# Patient Record
Sex: Female | Born: 1980 | Race: White | Hispanic: No | Marital: Married | Smoking: Former smoker
Health system: Southern US, Community
[De-identification: ages and names within clinical notes are randomized; demographics above are authoritative.]

## PROBLEM LIST (undated history)

## (undated) ENCOUNTER — Inpatient Hospital Stay (HOSPITAL_COMMUNITY): Payer: Self-pay

## (undated) DIAGNOSIS — F329 Major depressive disorder, single episode, unspecified: Secondary | ICD-10-CM

## (undated) DIAGNOSIS — D259 Leiomyoma of uterus, unspecified: Secondary | ICD-10-CM

## (undated) DIAGNOSIS — N83209 Unspecified ovarian cyst, unspecified side: Secondary | ICD-10-CM

## (undated) DIAGNOSIS — F17200 Nicotine dependence, unspecified, uncomplicated: Secondary | ICD-10-CM

## (undated) DIAGNOSIS — F32A Depression, unspecified: Secondary | ICD-10-CM

## (undated) HISTORY — DX: Nicotine dependence, unspecified, uncomplicated: F17.200

## (undated) HISTORY — DX: Major depressive disorder, single episode, unspecified: F32.9

## (undated) HISTORY — PX: WISDOM TOOTH EXTRACTION: SHX21

## (undated) HISTORY — DX: Leiomyoma of uterus, unspecified: D25.9

## (undated) HISTORY — DX: Depression, unspecified: F32.A

## (undated) HISTORY — DX: Unspecified ovarian cyst, unspecified side: N83.209

---

## 2004-11-19 ENCOUNTER — Other Ambulatory Visit: Admission: RE | Admit: 2004-11-19 | Discharge: 2004-11-19 | Payer: Self-pay | Admitting: Gynecology

## 2005-12-12 ENCOUNTER — Other Ambulatory Visit: Admission: RE | Admit: 2005-12-12 | Discharge: 2005-12-12 | Payer: Self-pay | Admitting: Gynecology

## 2006-01-26 ENCOUNTER — Emergency Department (HOSPITAL_COMMUNITY): Admission: EM | Admit: 2006-01-26 | Discharge: 2006-01-26 | Payer: Self-pay | Admitting: Emergency Medicine

## 2006-12-30 ENCOUNTER — Other Ambulatory Visit: Admission: RE | Admit: 2006-12-30 | Discharge: 2006-12-30 | Payer: Self-pay | Admitting: Gynecology

## 2008-01-19 ENCOUNTER — Encounter: Payer: Self-pay | Admitting: Women's Health

## 2008-01-19 ENCOUNTER — Other Ambulatory Visit: Admission: RE | Admit: 2008-01-19 | Discharge: 2008-01-19 | Payer: Self-pay | Admitting: Gynecology

## 2008-01-19 ENCOUNTER — Ambulatory Visit: Payer: Self-pay | Admitting: Women's Health

## 2008-05-11 HISTORY — PX: APPENDECTOMY: SHX54

## 2008-06-07 ENCOUNTER — Encounter (INDEPENDENT_AMBULATORY_CARE_PROVIDER_SITE_OTHER): Payer: Self-pay | Admitting: Surgery

## 2008-06-07 ENCOUNTER — Observation Stay (HOSPITAL_COMMUNITY): Admission: EM | Admit: 2008-06-07 | Discharge: 2008-06-08 | Payer: Self-pay | Admitting: Emergency Medicine

## 2008-06-07 ENCOUNTER — Ambulatory Visit: Payer: Self-pay | Admitting: Women's Health

## 2008-07-14 IMAGING — CR DG KNEE 1-2V*L*
2 series · 2 of 2 positions shown · non-contrast
Comparison: none

CLINICAL DATA: Left knee pain.
 LEFT KNEE ? 2 VIEW:

[view not recorded (1 of 2)]
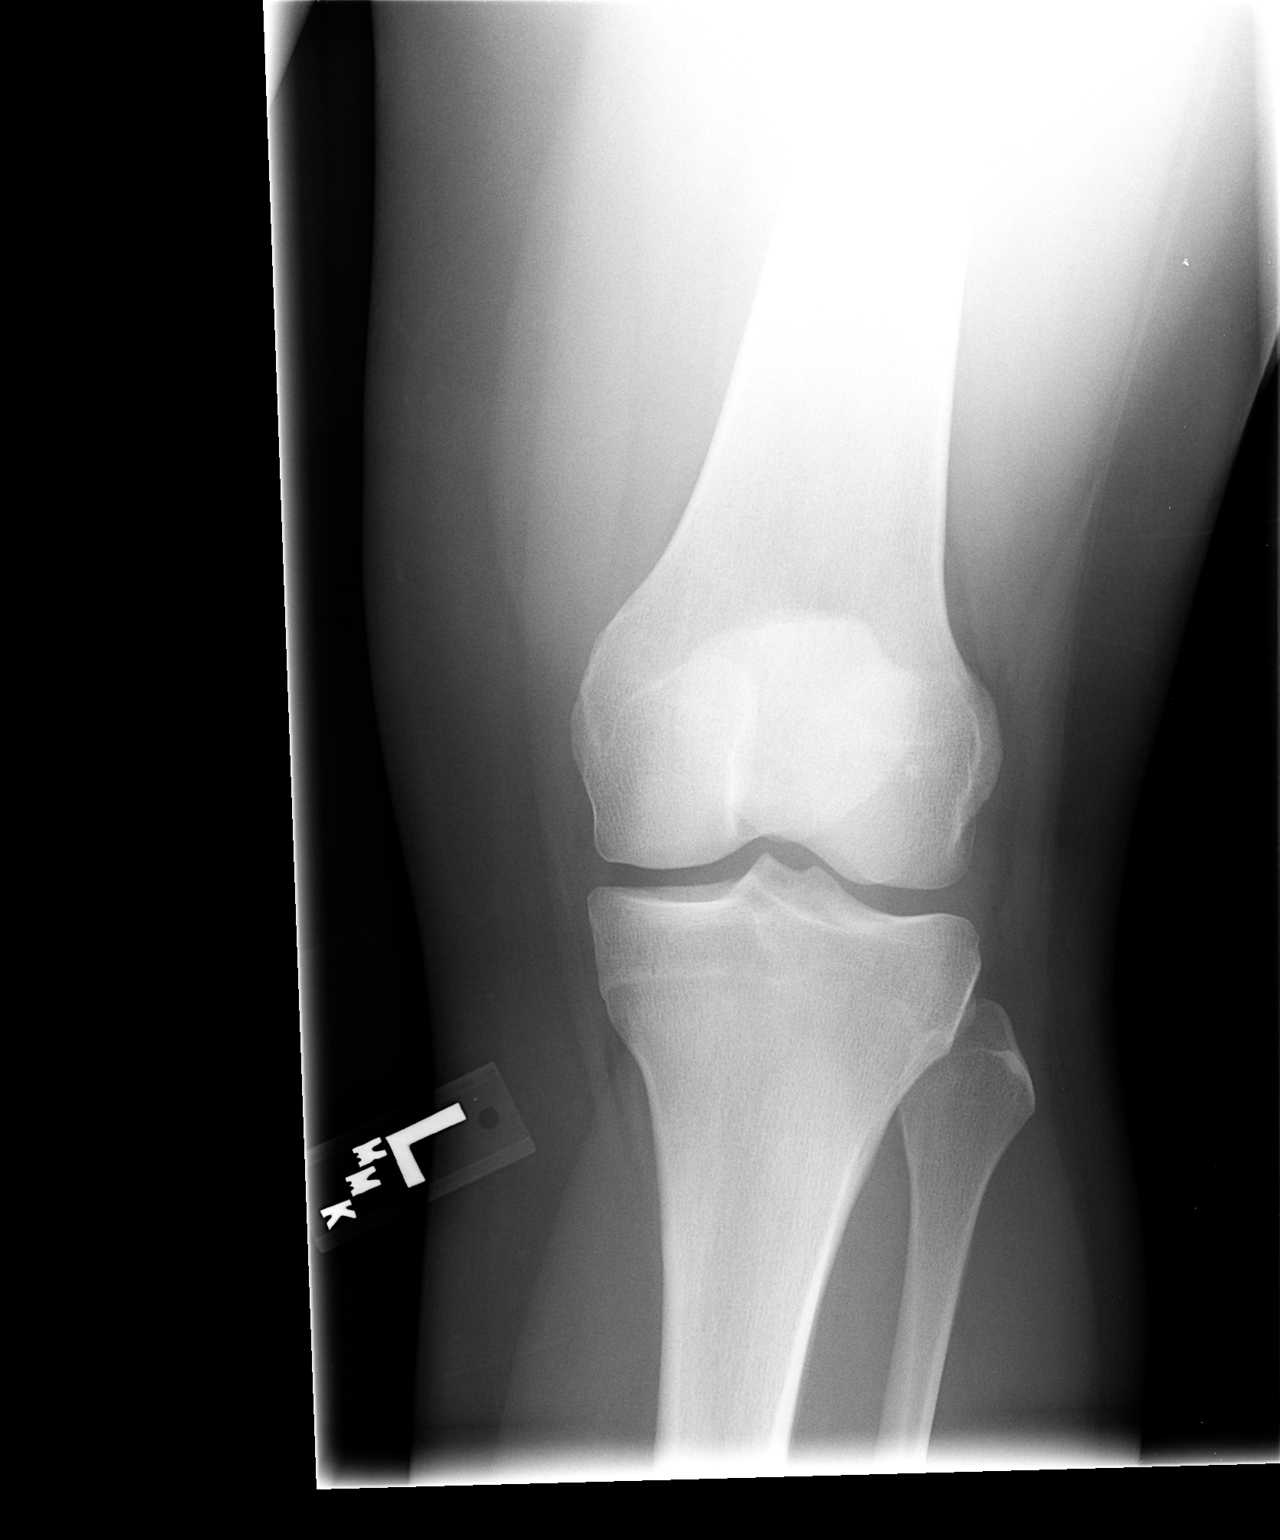

[view not recorded (2 of 2)]
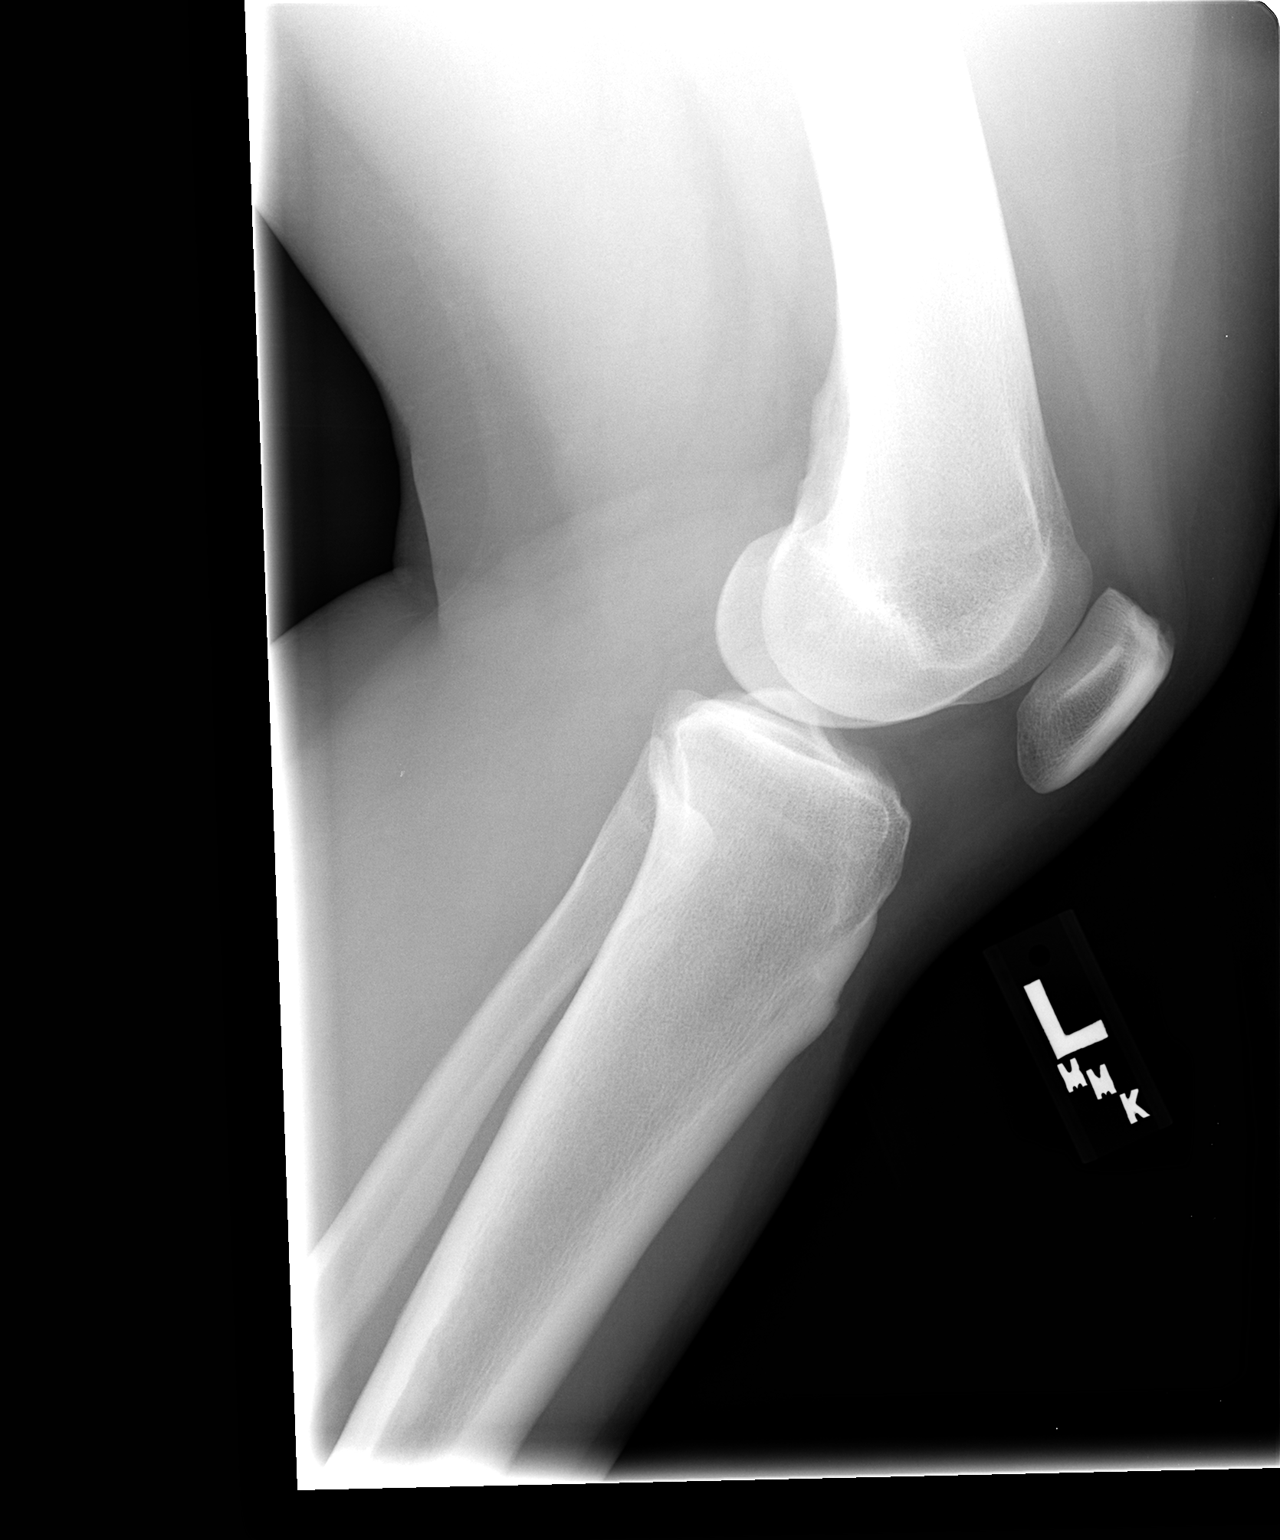

[2 of 2 positions shown; findings below may reference images not displayed]

FINDINGS: There is no evidence of fracture, dislocation, or joint effusion.  There is no evidence of arthropathy or other focal bone abnormality.  Soft tissues are unremarkable.
IMPRESSION: Negative.

## 2009-04-19 ENCOUNTER — Ambulatory Visit: Payer: Self-pay | Admitting: Women's Health

## 2009-04-19 ENCOUNTER — Other Ambulatory Visit: Admission: RE | Admit: 2009-04-19 | Discharge: 2009-04-19 | Payer: Self-pay | Admitting: Gynecology

## 2010-05-22 LAB — URINALYSIS, ROUTINE W REFLEX MICROSCOPIC
Nitrite: NEGATIVE
Protein, ur: NEGATIVE mg/dL
Specific Gravity, Urine: 1.012 (ref 1.005–1.030)
Urobilinogen, UA: 0.2 mg/dL (ref 0.0–1.0)
pH: 6 (ref 5.0–8.0)

## 2010-05-22 LAB — BASIC METABOLIC PANEL
CO2: 26 mEq/L (ref 19–32)
Calcium: 9.3 mg/dL (ref 8.4–10.5)
Chloride: 106 mEq/L (ref 96–112)
Creatinine, Ser: 0.86 mg/dL (ref 0.4–1.2)
GFR calc non Af Amer: >60 mL/min (ref >60)
Potassium: 4.1 mEq/L (ref 3.5–5.1)

## 2010-05-22 LAB — CBC
HCT: 41.8 % (ref 36.0–46.0)
Hemoglobin: 14.3 g/dL (ref 12.0–15.0)
MCHC: 34.2 g/dL (ref 30.0–36.0)
RBC: 4.57 MIL/uL (ref 3.87–5.11)
WBC: 13.9 10*3/uL — ABNORMAL HIGH (ref 4.0–10.5)

## 2010-05-22 LAB — DIFFERENTIAL
Basophils Absolute: 0.1 10*3/uL (ref 0.0–0.1)
Basophils Relative: 0 % (ref 0–1)
Eosinophils Relative: 1 % (ref 0–5)
Monocytes Relative: 6 % (ref 3–12)
Neutro Abs: 10.2 10*3/uL — ABNORMAL HIGH (ref 1.7–7.7)
Neutrophils Relative %: 73 % (ref 43–77)

## 2010-06-25 NOTE — Op Note (Signed)
NAMESAMEEN, LEAS                  ACCOUNT NO.:  [id number]   MEDICAL RECORD NO.:  [id number]          PATIENT TYPE:  OBV   LOCATION:  0111                         FACILITY:  [facility]   PHYSICIAN:  Clovis Pu. Cornett, M.D.DATE OF BIRTH:  1980-08-30   DATE OF PROCEDURE:  06/07/2008  DATE OF DISCHARGE:                               OPERATIVE REPORT   PREOPERATIVE DIAGNOSIS:  Acute appendicitis.   POSTOPERATIVE DIAGNOSIS:  Acute appendicitis.   PROCEDURE:  Laparoscopic appendectomy.   SURGEON:  Dr. Harriette Bouillon.   ANESTHESIA:  General endotracheal anesthesia with 0.25% Sensorcaine  local.   ESTIMATED BLOOD LOSS:  20 mL.   SPECIMEN:  Appendix to pathology.   DRAINS:  None.   INDICATIONS FOR PROCEDURE:  The patient is a 30 year old female with a 1-  day history of right lower quadrant pain.  She was seen in the emergency  room where a CT scan was obtained after examination and findings of  acute appendicitis were noted.  She is brought to the operating room for  laparoscopic appendectomy, possible open appendectomy for acute  appendicitis.  Preoperatively, we discussed the procedure, risks,  complications and long-term outcomes of the procedure.  She voiced  understanding of the above and agreed to proceed.   DESCRIPTION OF PROCEDURE:  The patient was brought to the operating room  and placed supine.  After induction of general anesthesia, the abdomen  was prepped and draped in a sterile fashion.  A Foley catheter was  placed and the left arm was tucked.  A 1-cm supraumbilical incision was  made.  Dissection was carried down and her fascia was opened in the  midline.  A pursestring suture of 0 Vicryl was placed a 12-mm Hassan  cannula was placed under direct vision.  Pneumoperitoneum was created to  15 mmHg of CO2 and the laparoscope was placed.  Upon inspection, I saw  no gross abnormality.  The small bowel and colon were normal.  The  gallbladder, liver and stomach were all  grossly normal.  Both ovaries  and uterus appeared grossly normal.  Two 5-mm ports were placed, one in  the midline halfway between the umbilicus and pubic symphysis and the  second in the left lower quadrant.  The cecum was identified.  The  appendix was then identified rolling the patient to her left.  There was  some acute changes to it.  The harmonic scalpel was used to take the  mesoappendix down to the base of the cecum.  Using a 5-mm scope with, a  GIA 45 stapling device was placed in the umbilical port, placed across  the base of the appendix and fired.  The appendix was placed in an  EndoCatch bag and extracted.  This was passed off the field.  We  reinserted the 10 mm scope.  There was some oozing from the staple line  and Surgicel was placed with an excellent hemostatic result.  The  mesoappendix was examined closely with no signs of any bleeding.  We  irrigated out any excess fluid and blood until clear.  At  this point in  time, I reexamined the small bowel, colon, gallbladder, stomach, uterus  and ovaries and saw no evidence of any injury or any abnormality.  At  this point in time, I removed the scope, I removed the ports, checking  for port site bleeding which there was none and passed these instruments  off the field.  The CO2 was allowed to escape.  At this point in time,  we closed the umbilical port with a pursestring suture of 0  Vicryl and a 4-0 Monocryl was used to close the skin in a subcuticular  fashion.  Dermabond was applied.  All final counts of sponge, needle and  instruments were found to be correct at the end of the case.  The  patient was then awakened, extubated and taken to the recovery room in  satisfactory condition.      Thomas A. Cornett, M.D.  Electronically Signed     TAC/MEDQ  D:  06/07/2008  T:  06/07/2008  Job:  [id number]

## 2010-06-25 NOTE — Discharge Summary (Signed)
Kerry [name], Kerry [name]                  ACCOUNT NO.:  [id number]   MEDICAL RECORD NO.:  [id number]          PATIENT TYPE:  OBV   LOCATION:  1523                         FACILITY:  [facility]   PHYSICIAN:  Clovis Pu. Cornett, M.D.DATE OF BIRTH:  06-12-1980   DATE OF ADMISSION:  06/07/2008  DATE OF DISCHARGE:                               DISCHARGE SUMMARY   ADMISSION DIAGNOSIS:  Acute appendicitis.   DISCHARGE DIAGNOSIS:  Acute appendicitis.   PROCEDURES PERFORMED:  Laparoscopic appendectomy.   BRIEF HISTORY:  The patient is a 30 year old female with acute  appendicitis who was admitted on June 07, 2008.  She underwent  laparoscopic appendectomy with no immediate complications on June 07, 2008.   HOSPITAL COURSE:  Hospital course was unremarkable.  She was discharged  home on postoperative day 1, tolerating her diet, having adequate p.o.  pain control.  Wounds were clean, dry, and intact.  Vital signs were  stable without fever.   DISCHARGE INSTRUCTIONS:  She will follow up in 3 to 4 weeks.  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  She may shower.  She will be  given Vicodin 1 to 2 q.4h. p.r.n. pain.  Follow up with me in 3 to 4  weeks.      Thomas A. Cornett, M.D.  Electronically Signed     TAC/MEDQ  D:  06/08/2008  T:  06/08/2008  Job:  [id number]

## 2010-06-25 NOTE — Consult Note (Signed)
Kerry [name], CHOKSHI                  ACCOUNT NO.:  [id number]   MEDICAL RECORD NO.:  [id number]          PATIENT TYPE:  OBV   LOCATION:  0111                         FACILITY:  [facility]   PHYSICIAN:  Clovis Pu. Cornett, M.D.DATE OF BIRTH:  04/18/1980   DATE OF CONSULTATION:  06/07/2008  DATE OF DISCHARGE:                                 CONSULTATION   REFERRING PHYSICIAN:  Dr. Effie Shy of the emergency room.   REASON FOR CONSULTATION:  Abdominal pain.   HISTORY OF PRESENT ILLNESS:  The patient is a 30 year old female with a  1-day history of right lower quadrant pain.  The pain started yesterday  morning, began to increase as the day went on yesterday and became  severe, probably on a scale of 7/10 to 8/10 to 9/10.  Overnight, she  placed a heating pad over her abdomen and this gave her some relief, but  this morning the pain became worse, located in her right lower quadrant  without radiation.  Heating pad made it somewhat better, but this  morning it became worse, severe in nature without radiation, sharp in  the right lower quadrant.  She had associated nausea and vomiting with  it.  Her bowel function and menstrual function had been normal.  Denies  any significant emesis.  The heating pad makes the pain better.  She was  seen today by Dr. Effie Shy in the emergency room who consulted me to see  her.   PAST MEDICAL HISTORY:  None.   PAST SURGICAL HISTORY:  None.   FAMILY HISTORY:  Positive for cancer and diabetes.   SOCIAL HISTORY:  Positive for tobacco use within the last 12 months.  She is married.  Denies drug use.  Does drink alcohol occasionally.   ALLERGIES TO MEDICATIONS:  None.   MEDICATIONS:  None, except birth control and is using the Ring.   REVIEW OF SYSTEMS:  Positive for abdominal pain, nausea, vomiting.  Denies any fever, chills.  Bowel functions has been normal.  Menstrual  periods have been normal.  Otherwise remainder of her 10-point review of  systems is  negative.   PHYSICAL EXAMINATION:  VITAL SIGNS:  Temperature is 98, pulse 99, blood  pressure 123/83.  HEENT:  No evidence of jaundice.  Oropharynx is clear.  Extraocular  movements are intact.  NECK:  Supple, nontender, full range of motion.  PULMONARY:  Clear to auscultation bilaterally.  Chest wall motion is  normal.  CARDIOVASCULAR:  Regular rate and rhythm without rub, murmur or gallop.  EXTREMITIES:  Warm and well perfused.  ABDOMEN:  Tender in the right lower quadrant with positive rebound and  guarding noted.  No mass.  No hernia.  MUSCULOSKELETAL:  Range of motion.  Muscle tone of both normal.  NEUROLOGIC:  Glasgow coma scale of 15.  Motor and sensory function are  grossly intact.   DIAGNOSTIC STUDIES:  Her CT scan of her abdomen and pelvis shows acute  appendicitis without perforation.  She has a white count 13,900 with a  left shift.  Hemoglobin is 14.3, platelet count is 309,000.  Urinalysis  normal.  Pregnancy test is negative.  Sodium 140, potassium 4.1,  chloride 106, CO2 26, BUN 12, creatinine 0.8, glucose 103.   IMPRESSION:  Acute appendicitis.   PLAN:  Recommend laparoscopic appendectomy with possibility of open  procedure.  Risk of bleeding, infection organ injury were discussed with  the patient.  She understands the above and agrees to proceed.      Thomas A. Cornett, M.D.  Electronically Signed     TAC/MEDQ  D:  06/07/2008  T:  06/07/2008  Job:  [id number]   cc:   Markham Jordan L. Effie Shy, M.D.  Fax: [phone]

## 2010-08-22 ENCOUNTER — Other Ambulatory Visit: Payer: Self-pay | Admitting: Women's Health

## 2010-08-22 ENCOUNTER — Encounter (INDEPENDENT_AMBULATORY_CARE_PROVIDER_SITE_OTHER): Payer: BC Managed Care – PPO | Admitting: Women's Health

## 2010-08-22 ENCOUNTER — Other Ambulatory Visit (HOSPITAL_COMMUNITY)
Admission: RE | Admit: 2010-08-22 | Discharge: 2010-08-22 | Disposition: A | Discharge status: Home / Self Care | Payer: BC Managed Care – PPO | Source: Ambulatory Visit | Attending: Gynecology | Admitting: Gynecology

## 2010-08-22 DIAGNOSIS — Z01419 Encounter for gynecological examination (general) (routine) without abnormal findings: Secondary | ICD-10-CM

## 2010-08-22 DIAGNOSIS — R82998 Other abnormal findings in urine: Secondary | ICD-10-CM

## 2010-08-22 DIAGNOSIS — Z124 Encounter for screening for malignant neoplasm of cervix: Secondary | ICD-10-CM | POA: Insufficient documentation

## 2010-12-31 ENCOUNTER — Telehealth: Payer: Self-pay

## 2010-12-31 NOTE — Telephone Encounter (Signed)
Telephone call, states stopped the nuva ring at the end of a cycle. This is first cycle since being off . Reviewed nuva ring and birth control pills help to keep cycles lighter. Reviewed okay to have a heavy cycle as long as not bleeding greater than one pad per hour. Is planning to try to conceive in the next month or so, will return to the office for a viability ultrasound. Continue prenatal vitamins daily. Encouraged Motrin when necessary for cramping.

## 2010-12-31 NOTE — Telephone Encounter (Signed)
STOPPED NUVARING ABOUT A MONTH AGO  FOR 2 WEKS AFTER HAD BREAST TNDERNESS. C-O SPOTTING AT OVULATION TOO. ALSO STARTED A PERIOD 12-30-10. STATES THE PERIOD IS VERY HEAVY(CHANGING TAMPONS ABOUT EVERY 2 HOURS). PT. WANTS TO KNOW IF THESE SYMPTOMS ARE NORMAL AFTER STOPPING RX.

## 2011-04-04 ENCOUNTER — Telehealth: Payer: Self-pay | Admitting: *Deleted

## 2011-04-04 NOTE — Telephone Encounter (Signed)
Pt has been off her nuvaring since October 2012. Pt states that every month she has had irregular periods. This month period started feb.12th and ended feb.17th which was a normal period flow. But then started back on feb.20th and still bleeding now. Pt said bleeding is very light, she did take a UPT and it was negative. Pt wanted to f/u with you about this. Please advise

## 2011-04-04 NOTE — Telephone Encounter (Signed)
Telephone call to review cycle periods stopped nuva ring in October had a normal cycle in November, December and January. February first  month for unusual cycle. Prior every 28 days for 5 days. Will watch at this time, if unusual. In march office visit to check labs. Return to office with missed cycle for viability ultrasound. Continue prenatal vitamins daily is aware safe pregnancy behaviors.

## 2011-05-29 ENCOUNTER — Telehealth: Payer: Self-pay | Admitting: *Deleted

## 2011-05-29 NOTE — Telephone Encounter (Signed)
Telephone call, if no cycle by Monday 4/22 office visit, we'll check hCG qualitative. Continue prenatal vitamins daily. States slight breast tenderness no nausea no other symptoms.

## 2011-05-29 NOTE — Telephone Encounter (Signed)
Pt and her husband have been trying to conceive for about 6 months now, her period is 4-5 days late and she took 2 UPT test and both are negative. LMP: 04/28/11. Please advise

## 2011-09-08 ENCOUNTER — Encounter: Payer: Self-pay | Admitting: Women's Health

## 2011-09-08 ENCOUNTER — Ambulatory Visit (INDEPENDENT_AMBULATORY_CARE_PROVIDER_SITE_OTHER): Payer: BC Managed Care – PPO | Admitting: Women's Health

## 2011-09-08 VITALS — BP 130/88 | Ht 62.5 in | Wt 204.0 lb

## 2011-09-08 DIAGNOSIS — L259 Unspecified contact dermatitis, unspecified cause: Secondary | ICD-10-CM

## 2011-09-08 DIAGNOSIS — L309 Dermatitis, unspecified: Secondary | ICD-10-CM | POA: Insufficient documentation

## 2011-09-08 DIAGNOSIS — Z01419 Encounter for gynecological examination (general) (routine) without abnormal findings: Secondary | ICD-10-CM

## 2011-09-08 DIAGNOSIS — N912 Amenorrhea, unspecified: Secondary | ICD-10-CM

## 2011-09-08 LAB — CBC WITH DIFFERENTIAL/PLATELET
Eosinophils Relative: 1 % (ref 0–5)
HCT: 40.2 % (ref 36.0–46.0)
Hemoglobin: 13.9 g/dL (ref 12.0–15.0)
Lymphocytes Relative: 30 % (ref 12–46)
MCHC: 34.6 g/dL (ref 30.0–36.0)
MCV: 89.1 fL (ref 78.0–100.0)
Neutro Abs: 6.1 10*3/uL (ref 1.7–7.7)
Platelets: 330 10*3/uL (ref 150–400)
RBC: 4.51 MIL/uL (ref 3.87–5.11)
RDW: 13.2 % (ref 11.5–15.5)

## 2011-09-08 LAB — TSH: TSH: 2.751 u[IU]/mL (ref 0.350–4.500)

## 2011-09-08 MED ORDER — MEDROXYPROGESTERONE ACETATE 10 MG PO TABS: 10.0000 mg | ORAL_TABLET | Freq: Every day | ORAL | Status: DC

## 2011-09-08 NOTE — Patient Instructions (Addendum)
Pregnancy  If you are planning on getting pregnant, it is a good idea to make a preconception appointment with your care- giver to discuss having a healthy lifestyle before getting pregnant. Such as, diet, weight, exercise, taking prenatal vitamins especially folic acid (it helps prevent brain and spinal cord defects), avoiding alcohol, smoking and illegal drugs, medical problems (diabetes, convulsions), family history of genetic problems, working conditions and immunizations. It is better to have knowledge of these things and do something about them before getting pregnant.  In your pregnancy, it is important to follow certain guidelines to have a healthy baby. It is very important to get good prenatal care and follow your caregiver's instructions. Prenatal care includes all the medical care you receive before your baby's birth. This helps to prevent problems during the pregnancy and childbirth.  HOME CARE INSTRUCTIONS    Start your prenatal visits by the 12th week of pregnancy or before when possible. They are usually scheduled monthly at first. They are more often in the last 2 months before delivery. It is important that you keep your caregiver's appointments and follow your caregiver's instructions regarding medication use, exercise, and diet.   During pregnancy, you are providing food for you and your baby. Eat a regular, well-balanced diet. Choose foods such as meat, fish, milk and other dairy products, vegetables, fruits, whole-grain breads and cereals. Your caregiver will inform you of the ideal weight gain depending on your current height and weight. Drink lots of liquids. Try to drink 8 glasses of water a day.   Alcohol is associated with a number of birth defects including fetal alcohol syndrome. It is best to avoid alcohol completely. Smoking will cause low birth rate and prematurity. Use of alcohol and nicotine during your pregnancy also increases the chances that your child will be chemically  dependent later in their life and may contribute to SIDS (Sudden Infant Death Syndrome).   Do not use illegal drugs.   Only take prescription or over-the-counter medications that are recommended by your caregiver. Other medications can cause genetic and physical problems in the baby.   Morning sickness can often be helped by keeping soda crackers at the bedside. Eat a couple before arising in the morning.   A sexual relationship may be continued until near the end of pregnancy if there are no other problems such as early (premature) leaking of amniotic fluid from the membranes, vaginal bleeding, painful intercourse or belly (abdominal) pain.   Exercise regularly. Check with your caregiver if you are unsure of the safety of some of your exercises.   Do not use hot tubs, steam rooms or saunas. These increase the risk of fainting or passing out and hurting yourself and the baby. Swimming is OK for exercise. Get plenty of rest, including afternoon naps when possible especially in the third trimester.   Avoid toxic odors and chemicals.   Do not wear high heels. They may cause you to lose your balance and fall.   Do not lift over 5 pounds. If you do lift anything, lift with your legs and thighs, not your back.   Avoid long trips, especially in the third trimester.   If you have to travel out of the city or state, take a copy of your medical records with you.  SEEK IMMEDIATE MEDICAL CARE IF:    You develop an unexplained oral temperature above 102 F (38.9 C), or as your caregiver suggests.   You have leaking of fluid from the vagina. If   your temperature and inform your caregiver of this when you call.   There is vaginal spotting or bleeding. Notify your caregiver of the amount and how many pads are used.   You continue to feel sick to your stomach (nauseous) and have no relief from remedies suggested, or you throw up (vomit) blood or coffee ground like  materials.   You develop upper abdominal pain.   You have round ligament discomfort in the lower abdominal area. This still must be evaluated by your caregiver.   You feel contractions of the uterus.   You do not feel the baby move, or there is less movement than before.   You have painful urination.   You have abnormal vaginal discharge.   You have persistent diarrhea.   You get a severe headache.   You have problems with your vision.   You develop muscle weakness.   You feel dizzy and faint.   You develop shortness of breath.   You develop chest pain.   You have back pain that travels down to your leg and feet.   You feel irregular or a very fast heartbeat.   You develop excessive weight gain in a short period of time (5 pounds in 3 to 5 days).   You are involved with a domestic violence situation.  Document Released: 01/27/2005 Document Revised: 01/16/2011 Document Reviewed: 07/21/2008 [facility] Patient Information 2012 [place], [place].Health Maintenance, Females A healthy lifestyle and preventative care can promote health and wellness.  Maintain regular health, dental, and eye exams.   Eat a healthy diet. Foods like vegetables, fruits, whole grains, low-fat dairy products, and lean protein foods contain the nutrients you need without too many calories. Decrease your intake of foods high in solid fats, added sugars, and salt. Get information about a proper diet from your caregiver, if necessary.   Regular physical exercise is one of the most important things you can do for your health. Most adults should get at least 150 minutes of moderate-intensity exercise (any activity that increases your heart rate and causes you to sweat) each week. In addition, most adults need muscle-strengthening exercises on 2 or more days a week.    Maintain a healthy weight. The body mass index (BMI) is a screening tool to identify possible weight problems. It provides an estimate of body  fat based on height and weight. Your caregiver can help determine your BMI, and can help you achieve or maintain a healthy weight. For adults 20 years and older:   A BMI below 18.5 is considered underweight.   A BMI of 18.5 to 24.9 is normal.   A BMI of 25 to 29.9 is considered overweight.   A BMI of 30 and above is considered obese.   Maintain normal blood lipids and cholesterol by exercising and minimizing your intake of saturated fat. Eat a balanced diet with plenty of fruits and vegetables. Blood tests for lipids and cholesterol should begin at age 57 and be repeated every 5 years. If your lipid or cholesterol levels are high, you are over 50, or you are a high risk for heart disease, you may need your cholesterol levels checked more frequently.Ongoing high lipid and cholesterol levels should be treated with medicines if diet and exercise are not effective.   If you smoke, find out from your caregiver how to quit. If you do not use tobacco, do not start.   If you are pregnant, do not drink alcohol. If you are breastfeeding, be very cautious  about drinking alcohol. If you are not pregnant and choose to drink alcohol, do not exceed 1 drink per day. One drink is considered to be 12 ounces (355 mL) of beer, 5 ounces (148 mL) of wine, or 1.5 ounces (44 mL) of liquor.   Avoid use of street drugs. Do not share needles with anyone. Ask for help if you need support or instructions about stopping the use of drugs.   High blood pressure causes heart disease and increases the risk of stroke. Blood pressure should be checked at least every 1 to 2 years. Ongoing high blood pressure should be treated with medicines, if weight loss and exercise are not effective.   If you are 76 to 30 years old, ask your caregiver if you should take aspirin to prevent strokes.   Diabetes screening involves taking a blood sample to check your fasting blood sugar level. This should be done once every 3 years, after age 58,  if you are within normal weight and without risk factors for diabetes. Testing should be considered at a younger age or be carried out more frequently if you are overweight and have at least 1 risk factor for diabetes.   Breast cancer screening is essential preventative care for women. You should practice "breast self-awareness." This means understanding the normal appearance and feel of your breasts and may include breast self-examination. Any changes detected, no matter how small, should be reported to a caregiver. Women in their 74s and 30s should have a clinical breast exam (CBE) by a caregiver as part of a regular health exam every 1 to 3 years. After age 72, women should have a CBE every year. Starting at age 58, women should consider having a mammogram (breast X-ray) every year. Women who have a family history of breast cancer should talk to their caregiver about genetic screening. Women at a high risk of breast cancer should talk to their caregiver about having an MRI and a mammogram every year.   The Pap test is a screening test for cervical cancer. Women should have a Pap test starting at age 43. Between ages 37 and 53, Pap tests should be repeated every 2 years. Beginning at age 30, you should have a Pap test every 3 years as long as the past 3 Pap tests have been normal. If you had a hysterectomy for a problem that was not cancer or a condition that could lead to cancer, then you no longer need Pap tests. If you are between ages 23 and 18, and you have had normal Pap tests going back 10 years, you no longer need Pap tests. If you have had past treatment for cervical cancer or a condition that could lead to cancer, you need Pap tests and screening for cancer for at least 20 years after your treatment. If Pap tests have been discontinued, risk factors (such as a new sexual partner) need to be reassessed to determine if screening should be resumed. Some women have medical problems that increase the  chance of getting cervical cancer. In these cases, your caregiver may recommend more frequent screening and Pap tests.   The human papillomavirus (HPV) test is an additional test that may be used for cervical cancer screening. The HPV test looks for the virus that can cause the cell changes on the cervix. The cells collected during the Pap test can be tested for HPV. The HPV test could be used to screen women aged 30 years and older, and should be  used in women of any age who have unclear Pap test results. After the age of 44, women should have HPV testing at the same frequency as a Pap test.   Colorectal cancer can be detected and often prevented. Most routine colorectal cancer screening begins at the age of 80 and continues through age 71. However, your caregiver may recommend screening at an earlier age if you have risk factors for colon cancer. On a yearly basis, your caregiver may provide home test kits to check for hidden blood in the stool. Use of a small camera at the end of a tube, to directly examine the colon (sigmoidoscopy or colonoscopy), can detect the earliest forms of colorectal cancer. Talk to your caregiver about this at age 25, when routine screening begins. Direct examination of the colon should be repeated every 5 to 10 years through age 64, unless early forms of pre-cancerous polyps or small growths are found.   Hepatitis C blood testing is recommended for all people born from [birth year] through 1964 and any individual with known risks for hepatitis C.   Practice safe sex. Use condoms and avoid high-risk sexual practices to reduce the spread of sexually transmitted infections (STIs). Sexually active women aged 16 and younger should be checked for Chlamydia, which is a common sexually transmitted infection. Older women with new or multiple partners should also be tested for Chlamydia. Testing for other STIs is recommended if you are sexually active and at increased risk.   Osteoporosis is a  disease in which the bones lose minerals and strength with aging. This can result in serious bone fractures. The risk of osteoporosis can be identified using a bone density scan. Women ages 24 and over and women at risk for fractures or osteoporosis should discuss screening with their caregivers. Ask your caregiver whether you should be taking a calcium supplement or vitamin D to reduce the rate of osteoporosis.   Menopause can be associated with physical symptoms and risks. Hormone replacement therapy is available to decrease symptoms and risks. You should talk to your caregiver about whether hormone replacement therapy is right for you.   Use sunscreen with a sun protection factor (SPF) of 30 or greater. Apply sunscreen liberally and repeatedly throughout the day. You should seek shade when your shadow is shorter than you. Protect yourself by wearing long sleeves, pants, a wide-brimmed hat, and sunglasses year round, whenever you are outdoors.   Notify your caregiver of new moles or changes in moles, especially if there is a change in shape or color. Also notify your caregiver if a mole is larger than the size of a pencil eraser.   Stay current with your immunizations.  Document Released: 08/12/2010 Document Revised: 01/16/2011 Document Reviewed: 08/12/2010 [facility] Patient Information 2012 [place], [place].

## 2011-09-08 NOTE — Progress Notes (Signed)
Kerry [name] March 31, 1980 [id number]    History:    The patient presents for annual exam.  Stopped using nuva ring December 2012, desiring conception. Cycles have progressively gotten more spaced, today 39 days. States has premenstrual breast tenderness, low back ache. States does see  ovulation-type discharge midcycle. Sexually active 3-4 times per week. Has increased exercise and decrease calories with no weight loss. Rubella immune. History of normal Paps. Did not receive gardasil.   Past medical history, past surgical history, family history and social history were all reviewed and documented in the EPIC chart. History of eczema. Works at [employer] in the [employer]. Husband starting a business in land survey.   ROS:  A  ROS was performed and pertinent positives and negatives are included in the history.  Exam:  Filed Vitals:   09/08/11 1450  BP: 130/88    General appearance:  Normal Head/Neck:  Normal, without cervical or supraclavicular adenopathy. Thyroid:  Symmetrical, normal in size, without palpable masses or nodularity. Respiratory  Effort:  Normal  Auscultation:  Clear without wheezing or rhonchi Cardiovascular  Auscultation:  Regular rate, without rubs, murmurs or gallops  Edema/varicosities:  Not grossly evident Abdominal  Soft,nontender, without masses, guarding or rebound.  Liver/spleen:  No organomegaly noted  Hernia:  None appreciated  Skin  Inspection:  Grossly normal  Palpation:  Grossly normal Neurologic/psychiatric  Orientation:  Normal with appropriate conversation.  Mood/affect:  Normal  Genitourinary    Breasts: Examined lying and sitting.     Right: Without masses, retractions, discharge or axillary adenopathy.     Left: Without masses, retractions, discharge or axillary adenopathy.   Inguinal/mons:  Normal without inguinal adenopathy  External genitalia:  Normal  BUS/Urethra/Skene's glands:  Normal  Bladder:  Normal  Vagina:   Normal  Cervix:  Normal  Uterus:   Midline and mobile  Adnexa/parametria:     Rt: Without masses or tenderness.   Lt: Without masses or tenderness.  Anus and perineum: Normal  Digital rectal exam: Normal sphincter tone without palpated masses or tenderness  Assessment/Plan:  31 y.o. M. WF G0 for annual exam desiring conception.  Irregular cycles Desiring conception Obesity  Plan:  CBC, TSH, prolactin, hCG qualitative, UA. No Pap, history of all normal Paps, new screening guidelines reviewed. If qual negative withdraw with Provera 10 for 5 days. Reviewed if all labs are normal, semen analysis, appointment with Dr. Audie Box to discuss fertility management. Will continue frequent intercourse, calcium rich diet, MVI daily encouraged. Aware of safe behaviors in pregnancy. SBE's, continue exercise, decrease calories and carbs for weight loss.  [facility], 3:41 PM 09/08/2011

## 2011-09-09 LAB — URINALYSIS W MICROSCOPIC + REFLEX CULTURE
Bacteria, UA: NONE SEEN
Bilirubin Urine: NEGATIVE
Casts: NONE SEEN
Hgb urine dipstick: NEGATIVE
Ketones, ur: NEGATIVE mg/dL
Leukocytes, UA: NEGATIVE
Nitrite: NEGATIVE
Protein, ur: NEGATIVE mg/dL
Specific Gravity, Urine: 1.015 (ref 1.005–1.030)
Squamous Epithelial / LPF: NONE SEEN
pH: 6 (ref 5.0–8.0)

## 2011-09-10 ENCOUNTER — Telehealth: Payer: Self-pay | Admitting: Women's Health

## 2011-09-10 NOTE — Telephone Encounter (Signed)
Patient informed of results and to take Provera.  Patient wanted me to let [place] know.  You had asked her if mom ever had infertility problems and she said no. Upon talking with her mom she found out that with her first pregnancy she tried for a year, took Clomid and conceived. After that had more children with no infertility issues.  I told patient that did not sound like history that should impact her but I would make you aware of it.  She did not want to schedule a consult with Dr. Velvet Bathe today but may call back.

## 2011-09-10 NOTE — Telephone Encounter (Signed)
Message copied by Keenan Bachelor on Wed Sep 10, 2011 12:46 PM ------      Message from: [place], [place] J      Created: Wed Sep 10, 2011  9:32 AM       Please call and inform blood pregnancy test negative if cycle has not started to take Provera for 5 days call if no cycle within 2 weeks. Inform TSH and prolactin normal. Let me know if she wants to proceed with further testing/consult with Dr. Audie Box to discuss fertility management.

## 2011-10-21 ENCOUNTER — Telehealth: Payer: Self-pay | Admitting: *Deleted

## 2011-10-21 NOTE — Telephone Encounter (Signed)
(  Pt aware you are out of the office)  Pt finished her provera rx had cycle on 09/16/11 lasted until 09/20/11 no cycle since then. Pt is calling to follow up. She asked if you would like try provera again? Or should she just make consult with TF? Please advise

## 2011-10-22 NOTE — Telephone Encounter (Signed)
Telephone call, states has had several negative home U PTs. Has used ovulation predictor kits that have shown no change. Currently on day 38, did have a withdrawal bleed after Provera last month. Stopped [employer] October 2012. Had regular monthly cycles October through January 2013, cycles began spacing, had one cycle 54 days. Normal TSH and prolactin 08/2011. Having intercourse 4-5 times per week. Exercising 5 times per week eating a healthy diet. Instructed to schedule office visit with Dr. Audie Box to develop fertility plan

## 2011-10-28 ENCOUNTER — Encounter: Payer: Self-pay | Admitting: Gynecology

## 2011-10-28 ENCOUNTER — Ambulatory Visit (INDEPENDENT_AMBULATORY_CARE_PROVIDER_SITE_OTHER): Payer: BC Managed Care – PPO | Admitting: Gynecology

## 2011-10-28 VITALS — BP 126/82

## 2011-10-28 DIAGNOSIS — Z3490 Encounter for supervision of normal pregnancy, unspecified, unspecified trimester: Secondary | ICD-10-CM | POA: Insufficient documentation

## 2011-10-28 DIAGNOSIS — Z331 Pregnant state, incidental: Secondary | ICD-10-CM

## 2011-10-28 DIAGNOSIS — N912 Amenorrhea, unspecified: Secondary | ICD-10-CM

## 2011-10-28 LAB — PREGNANCY, URINE: Preg Test, Ur: POSITIVE

## 2011-10-28 NOTE — Progress Notes (Signed)
Patient is a 31 year old now gravida 1 para 0 who has had long-standing history of oligomenorrhea and her last menstrual period was initiated with Provera 10 mg which she took for 10 days. The first day of her last menstrual period was August 6. Her urine pregnancy today was negative in the office. Patient was otherwise asymptomatic with the exception of slight breast tenderness. Patient with no prior history of PID or pelvic surgery.  Exam: Abdomen: Soft nontender no rebound or guarding Pelvic: Bartholin urethra Skene glands within normal limits Vagina: No lesions or discharge Cervix: No lesions or discharge Uterus: 4-6 weeks size nontender Adnexa: No palpable masses or tenderness Rectal exam: Not done  Office urine pregnancy test positive  Assessment/plan: Early first trimester pregnancy. Patient will have a quantitative beta-hCG and return to the office next week for possible ultrasound. She'll be started on prenatal vitamins. Early pregnancy instruction provided. Of note July of this year her CBC, prolactin, TSH were all normal.

## 2011-10-28 NOTE — Patient Instructions (Addendum)
How a Baby Grows During Pregnancy Pregnancy begins when the female's sperm enters the female's egg. This happens in the fallopian tube and is called fertilization. The fertilized egg is called an embryo until it reaches 9 weeks from the time of fertilization. From 9 weeks until birth it is called a fetus. The fertilized egg moves down the tube into the uterus and attaches to the inside lining of the uterus.  The pregnant woman is responsible for the growth of the embryo/fetus by supplying nourishment and oxygen through the blood stream and placenta to the developing fetus. The uterus becomes larger and pops out from the abdomen more and more as the fetus develops and grows. A normal pregnancy lasts 280 days, with a range of 259 to 294 days, or 40 weeks. The pregnancy is divided up into three trimesters:  First trimester - 0 to 13 weeks.   Second trimester - 14 to 27 weeks.   Third trimester - 28 to 40 weeks.  The day your baby is supposed to be born is called estimated date of confinement [facility]) or estimated date of delivery (EDD). GROWTH OF THE BABY MONTH BY MONTH 1. First Month: The fertilized egg attaches to the inside of the uterus and certain cells will form the placenta and others will develop into the fetus. The arms, legs, brain, spinal cord, lungs, and heart begin to develop. At the end of the first month the heart begins to beat. The embryo weighs less than an ounce and is  inch long.  2. Second Month: The bones can be seen, the inner ear, eye lids, hands and feet form and genitals develop. By the end of 8 weeks, all of the major organs are developing. The fetus now weighs less than an ounce and is one inch (2.54 cm) long.  3. Third Month: Teeth buds appear, all the internal organs are forming, bones and muscles begin to grow, the spine can flex and the skin is transparent. Finger and toe nails begin to form, the hands develop faster than the feet and the arms are longer than the legs at this  point. The fetus weighs a little more than an ounce (0.03 kg) and is 3 inches (8.89cm) long.  4. Fourth Month: The placenta is completely formed. The external sex organs, neck, outer ear, eyebrows, eyelids and fingernails are formed. The fetus can hear, swallow, flex its arms and legs and the kidney begins to produce urine. The skin is covered with a white waxy coating (vernix) and very thin hair (lanugo) is present. The fetus weighs 5 ounces (0.14kg) and is 6 to 7 inches (16.51cm) long.  5. Fifth Month: The fetus moves around more and can be felt for the first time (called quickening), sleeps and wakes up at times, may begin to suck its finger and the nails grow to the end of the fingers. The gallbladder is now functioning and helps to digest the nutrients, eggs are formed in the female and the testicles begin to drop down from the abdomen to the scrotum in the female. The fetus weighs  to 1 pound (0.45kg) and is 10 inches (25.4cm) long.  6. Sixth Month: The lungs are formed but the fetus does not breath yet. The eyes open, the brain develops more quickly at this time, one can detect finger and toe prints and thicker hair grows. The fetus weighs 1 to 1 pounds (0.68kg) and is 12 inches (30.48cm) long.  7. Seventh Month: The fetus can hear and  respond to sounds, kicks and stretches and can sense changes in light. The fetus weighs 2 to 2 pounds (1.13kg) and is 14 inches (35.56cm) long.  8. Eight Month: All organs and body systems are fully developed and functioning. The bones get harder, taste buds develop and can taste sweet and sour flavors and the fetus may hiccup now. Different parts of the brain are developing and the skull remains soft for the brain to grow. The fetus weighs 5 pounds (2.27kg) and is 18 inches (45.75cm) long.  9. Ninth Month: The fetus gains about a half a pound a week, the lungs are fully developed, patterns of sleep develop and the head moves down into the bottom of the uterus called  vertex. If the buttocks moves into the bottom of the uterus, it is called a breech. The fetus weighs 6 to 9 pounds (2.72 to 4.08kg) and is 20 inches (50.8cm) long.  You should be informed about your pregnancy, yourself and how the baby is developing as much as possible. Being informed helps you to enjoy this experience. It also gives you the sense to feel if something is not going right and when to ask questions. Talk to your caregiver when you have questions about your baby or your own body. Document Released: 07/16/2007 Document Revised: 01/16/2011 Document Reviewed: 07/16/2007 [facility] Patient Information 2012 [place], [place].

## 2011-10-29 ENCOUNTER — Other Ambulatory Visit: Payer: Self-pay | Admitting: Gynecology

## 2011-10-29 ENCOUNTER — Encounter: Payer: Self-pay | Admitting: Gynecology

## 2011-10-29 DIAGNOSIS — Z3201 Encounter for pregnancy test, result positive: Secondary | ICD-10-CM

## 2011-10-29 LAB — HCG, QUANTITATIVE, PREGNANCY: hCG, Beta Chain, Quant, S: 519 m[IU]/mL

## 2011-11-03 ENCOUNTER — Other Ambulatory Visit: Payer: BC Managed Care – PPO

## 2011-11-03 DIAGNOSIS — Z3201 Encounter for pregnancy test, result positive: Secondary | ICD-10-CM

## 2011-11-03 DIAGNOSIS — Z3490 Encounter for supervision of normal pregnancy, unspecified, unspecified trimester: Secondary | ICD-10-CM

## 2011-11-03 DIAGNOSIS — N912 Amenorrhea, unspecified: Secondary | ICD-10-CM

## 2011-11-04 ENCOUNTER — Institutional Professional Consult (permissible substitution): Payer: BC Managed Care – PPO | Admitting: Gynecology

## 2011-11-04 LAB — HCG, QUANTITATIVE, PREGNANCY: hCG, Beta Chain, Quant, S: 6438.7 m[IU]/mL

## 2011-11-06 ENCOUNTER — Ambulatory Visit (INDEPENDENT_AMBULATORY_CARE_PROVIDER_SITE_OTHER): Payer: BC Managed Care – PPO

## 2011-11-06 ENCOUNTER — Ambulatory Visit (INDEPENDENT_AMBULATORY_CARE_PROVIDER_SITE_OTHER): Payer: BC Managed Care – PPO | Admitting: Gynecology

## 2011-11-06 ENCOUNTER — Encounter: Payer: Self-pay | Admitting: Gynecology

## 2011-11-06 VITALS — BP 104/70

## 2011-11-06 DIAGNOSIS — Z3490 Encounter for supervision of normal pregnancy, unspecified, unspecified trimester: Secondary | ICD-10-CM

## 2011-11-06 DIAGNOSIS — N912 Amenorrhea, unspecified: Secondary | ICD-10-CM

## 2011-11-06 DIAGNOSIS — Z331 Pregnant state, incidental: Secondary | ICD-10-CM

## 2011-11-06 LAB — US OB TRANSVAGINAL

## 2011-11-06 NOTE — Progress Notes (Signed)
Patient is a 31 year old now gravida 1 para 0 who has had long-standing history of oligomenorrhea and her last menstrual period was initiated with Provera 10 mg which she took for 10 days. The first day of her last menstrual period was August 6. She was seen in the office on September 17 her urine pregnancy test was positive and she had quantitative beta-hCGs drawn as follows:   Results for CALYSTA, CRAIGO (MRN [id number]) as of 11/06/2011 09:57  Ref. Range 10/28/2011 11:40 11/03/2011 08:40  hCG, Beta Chain, Quant, S No range found 519.0 6438.7   Based on her last menstrual period of 09/16/2011 she would currently be 7 weeks and 2 days with an estimated date of confinement 06/22/2012.  Ultrasound today: Intrauterine gestational sac with yolk sac was seen. A fetal pole was not seen yet. Ovaries appeared to be normal. A small right corpus luteum cyst was noted. Small amount of fluid in the cul-de-sac. Based on ultrasound she is currently 6 weeks and 2 days with a due date of 06/29/2012.  Assessment/plan: Early first trimester pregnancy with appropriate rising quantitative beta-hCG. Patient asymptomatic return back to the office in 2 weeks for followup ultrasound.

## 2011-11-20 ENCOUNTER — Ambulatory Visit (INDEPENDENT_AMBULATORY_CARE_PROVIDER_SITE_OTHER): Payer: BC Managed Care – PPO

## 2011-11-20 ENCOUNTER — Encounter: Payer: Self-pay | Admitting: Gynecology

## 2011-11-20 ENCOUNTER — Ambulatory Visit (INDEPENDENT_AMBULATORY_CARE_PROVIDER_SITE_OTHER): Payer: BC Managed Care – PPO | Admitting: Gynecology

## 2011-11-20 VITALS — BP 110/70

## 2011-11-20 DIAGNOSIS — Z331 Pregnant state, incidental: Secondary | ICD-10-CM

## 2011-11-20 DIAGNOSIS — O2690 Pregnancy related conditions, unspecified, unspecified trimester: Secondary | ICD-10-CM

## 2011-11-20 DIAGNOSIS — Z3490 Encounter for supervision of normal pregnancy, unspecified, unspecified trimester: Secondary | ICD-10-CM

## 2011-11-20 DIAGNOSIS — N912 Amenorrhea, unspecified: Secondary | ICD-10-CM

## 2011-11-20 DIAGNOSIS — O3680X0 Pregnancy with inconclusive fetal viability, not applicable or unspecified: Secondary | ICD-10-CM

## 2011-11-20 DIAGNOSIS — Z23 Encounter for immunization: Secondary | ICD-10-CM

## 2011-11-20 LAB — US OB TRANSVAGINAL

## 2011-11-20 NOTE — Progress Notes (Addendum)
Patient is a 31 year old now gravida 1 para 0 who has had long-standing history of oligomenorrhea and her last menstrual period was initiated with Provera 10 mg which she took for 10 days. The first day of her last menstrual period was August 6. She was seen in the office on September 17 her urine pregnancy test was positive and she had quantitative beta-hCGs drawn as follows:   Results for BRYNDLE, CORREDOR (MRN [id number]) as of 11/06/2011 09:57   Ref. Range  10/28/2011 11:40  11/03/2011 08:40   hCG, Beta Chain, Quant, S  No range found  519.0  6438.7   Based on her last menstrual period of 09/16/2011 she would currently be 7 weeks and 2 days with an estimated date of confinement 06/22/2012.   Ultrasound 11/06/2011: Intrauterine gestational sac with yolk sac was seen. A fetal pole was not seen yet. Ovaries appeared to be normal. A small right corpus luteum cyst was noted. Small amount of fluid in the cul-de-sac. Based on ultrasound she is currently 6 weeks and 2 days with a due date of 06/29/2012.  Patient presented today for followup ultrasound for fetal viability results as follows:  Ultrasound 11/20/2011: Viable intrauterine pregnancy seen in the fundus of the uterus. Fetal pole cardiac activity seen. Crown-rump length 14.9 mm consistent with 7 weeks and 6 days. Fetal heart movements were noted 156 beats per minute. A normal yolk sac measuring 4.7 mm was noted. Cervix was long closed. Right ovarian corpus luteum cyst measuring 20 x 27 mm was noted. Left ovary was normal. No fluid in the cul-de-sac.  Assessment/plan: Early intrauterine pregnancy at 8 weeks and 2 days with a due date of 06/29/2012 based on last menstrual period. By ultrasound patient 7 weeks and 6 days with her corrected estimated date of confinement 07/02/2012. Patient's current prenatal vitamins and is doing well. She received her flu vaccine today. She will be referred to my obstetrical colleagues for the remainder of her pregnancy.

## 2011-11-27 LAB — OB RESULTS CONSOLE RPR: RPR: NONREACTIVE

## 2011-11-27 LAB — OB RESULTS CONSOLE ABO/RH: RH Type: POSITIVE

## 2011-11-27 LAB — OB RESULTS CONSOLE HIV ANTIBODY (ROUTINE TESTING): HIV: NONREACTIVE

## 2011-11-27 LAB — OB RESULTS CONSOLE GC/CHLAMYDIA: Gonorrhea: NEGATIVE

## 2011-11-27 LAB — OB RESULTS CONSOLE RUBELLA ANTIBODY, IGM: Rubella: IMMUNE

## 2012-02-11 NOTE — L&D Delivery Note (Signed)
Delivery Note At 5:57 PM a viable female was delivered via Vaginal, Spontaneous Delivery (Presentation:vtx oa ;  ).  APGAR: 9, 9; weight .   Placenta status: Intact, Spontaneous.  Cord: 3 vessels with the following complications:NONE .  Cord pH: NA  Anesthesia: Epidural  Episiotomy: None Lacerations: 2nd degree Suture Repair: 2.0 chromic Est. Blood Loss (mL): 450  Mom to postpartum.  Baby to nursery-stable.  Sebron Mcmahill S 07/03/2012, 6:30 PM

## 2012-03-22 ENCOUNTER — Encounter (HOSPITAL_COMMUNITY): Payer: Self-pay

## 2012-03-22 ENCOUNTER — Inpatient Hospital Stay (HOSPITAL_COMMUNITY)
Admission: AD | Admit: 2012-03-22 | Discharge: 2012-03-22 | Disposition: A | Discharge status: Home / Self Care | Payer: BC Managed Care – PPO | Source: Ambulatory Visit | Attending: Obstetrics and Gynecology | Admitting: Obstetrics and Gynecology

## 2012-03-22 DIAGNOSIS — R109 Unspecified abdominal pain: Secondary | ICD-10-CM | POA: Insufficient documentation

## 2012-03-22 DIAGNOSIS — K2970 Gastritis, unspecified, without bleeding: Secondary | ICD-10-CM | POA: Insufficient documentation

## 2012-03-22 DIAGNOSIS — K219 Gastro-esophageal reflux disease without esophagitis: Secondary | ICD-10-CM | POA: Insufficient documentation

## 2012-03-22 DIAGNOSIS — K2990 Gastroduodenitis, unspecified, without bleeding: Secondary | ICD-10-CM

## 2012-03-22 DIAGNOSIS — O99891 Other specified diseases and conditions complicating pregnancy: Secondary | ICD-10-CM | POA: Insufficient documentation

## 2012-03-22 LAB — URINALYSIS, ROUTINE W REFLEX MICROSCOPIC
Hgb urine dipstick: NEGATIVE
Ketones, ur: NEGATIVE mg/dL
Leukocytes, UA: NEGATIVE
Urobilinogen, UA: 0.2 mg/dL (ref 0.0–1.0)
pH: 6.5 (ref 5.0–8.0)

## 2012-03-22 MED ORDER — RANITIDINE HCL 150 MG PO TABS: 150.0000 mg | ORAL_TABLET | Freq: Two times a day (BID) | ORAL | Status: DC

## 2012-03-22 MED ORDER — GI COCKTAIL ~~LOC~~
30.0000 mL | Freq: Once | ORAL | Status: AC
  Administered 2012-03-22: 30 mL via ORAL
  Filled 2012-03-22: qty 30

## 2012-03-22 NOTE — MAU Note (Signed)
Sudden onset of upper abdominal pain that wraps around to her back. Feels like a belt tightening around her, constant. Started at 10 pm. Denies leaking of fluid or vaginal bleeding.

## 2012-03-22 NOTE — MAU Provider Note (Signed)
History     CSN: [id number]  Arrival date and time: 03/22/12 0000   First Provider Initiated Contact with Patient 03/22/12 0044      Chief Complaint  Patient presents with  . Abdominal Pain   HPI Ms. Kerry [name] is a 32 y.o. G1P0000 at [redacted]w[redacted]d who presents to MAU today with upper abdominal pain. The patient states that this started around 9:30 pm today and has been constant. She described the pain as a gripping that has radiated around to the back. She denies N/V/D, fever, vaginal bleeding, abnormal vaginal discharge or LOF. She reports a normal appetite. She had chicken dip and pizza for dinner around 7:00 pm. She tried tylenol and tum prior to coming in today with some relief. She reports good fetal movement.   OB History   Grav Para Term Preterm Abortions TAB SAB Ect Mult Living   1 0 0 0 0 0 0 0 0 0       Past Medical History  Diagnosis Date  . Smoker     1-2 CIG PER DAY  . Migraine     Past Surgical History  Procedure Laterality Date  . Appendectomy  05/2008    Family History  Problem Relation Age of Onset  . Diabetes Maternal Grandmother   . Cancer Maternal Grandmother     pancreas  . Diabetes Maternal Grandfather   . Hypertension Maternal Grandfather     History  Substance Use Topics  . Smoking status: Former Smoker    Quit date: 10/28/2011  . Smokeless tobacco: Never Used  . Alcohol Use: No     Comment: social    Allergies: No Known Allergies  Prescriptions prior to admission  Medication Sig Dispense Refill  . acetaminophen (TYLENOL) 500 MG tablet Take 1,000 mg by mouth every 6 (six) hours as needed for pain.      . calcium carbonate (TUMS - DOSED IN MG ELEMENTAL CALCIUM) 500 MG chewable tablet Chew 4 tablets by mouth as needed for heartburn.      . Prenatal Vit-Fe Fumarate-FA (PRENATAL MULTIVITAMIN) TABS Take 1 tablet by mouth daily.      . Multiple Vitamins-Iron (MULTIVITAMIN/IRON PO) Take by mouth.        . [DISCONTINUED] medroxyPROGESTERone  (PROVERA) 10 MG tablet Take 1 tablet (10 mg total) by mouth daily.  5 tablet  0    Review of Systems  Constitutional: Negative for fever, chills and malaise/fatigue.  Cardiovascular: Negative for chest pain.  Gastrointestinal: Positive for abdominal pain. Negative for heartburn, nausea, vomiting, diarrhea and constipation.  Musculoskeletal: Positive for back pain.   Physical Exam   Blood pressure 124/78, pulse 91, temperature 98.2 F (36.8 C), temperature source Oral, resp. rate 20, height 5\' 2"  (1.575 m), weight 210 lb 6.4 oz (95.437 kg), last menstrual period 09/16/2011, SpO2 99.00%.  Physical Exam  Constitutional: She is oriented to person, place, and time. She appears well-developed and well-nourished. No distress.  HENT:  Head: Normocephalic and atraumatic.  Cardiovascular: Normal rate, regular rhythm and normal heart sounds.   Respiratory: Effort normal and breath sounds normal. No respiratory distress.  GI: Soft. Bowel sounds are normal. She exhibits no distension and no mass. There is tenderness (mild discomfort with deep palpation of the epigastric region and LUQ). There is no rebound and no guarding.  Neurological: She is alert and oriented to person, place, and time.  Skin: Skin is warm and dry. No erythema.  Psychiatric: She has a normal mood and affect.  Results for orders placed during the hospital encounter of 03/22/12 (from the past 24 hour(s))  URINALYSIS, ROUTINE W REFLEX MICROSCOPIC     Status: None   Collection Time    03/22/12 12:12 AM      Result Value Range   Color, Urine YELLOW  YELLOW   APPearance CLEAR  CLEAR   Specific Gravity, Urine 1.025  1.005 - 1.030   pH 6.5  5.0 - 8.0   Glucose, UA NEGATIVE  NEGATIVE mg/dL   Hgb urine dipstick NEGATIVE  NEGATIVE   Bilirubin Urine NEGATIVE  NEGATIVE   Ketones, ur NEGATIVE  NEGATIVE mg/dL   Protein, ur NEGATIVE  NEGATIVE mg/dL   Urobilinogen, UA 0.2  0.0 - 1.0 mg/dL   Nitrite NEGATIVE  NEGATIVE   Leukocytes, UA  NEGATIVE  NEGATIVE    MAU Course  Procedures None  MDM Discussed patient with Dr. Vincente Poli. She would like to try a GI cocktail and if patient gets relief she can be discharged with Rx for Zantac GI cocktail given - patient reports significant relief   Assessment and Plan  A: Gastritis GERD  P: Discharge home Rx for Zantac sent to patient's pharmacy Discussed diet changes and behaviors to help avoid reflux symptoms Patient will keep scheduled follow-up with Dr. Arelia Sneddon this week Patient may return to MAU as needed or if her condition should change or worsen  Freddi Starr, PA-C  03/22/2012, 1:29 AM

## 2012-06-23 ENCOUNTER — Encounter (HOSPITAL_COMMUNITY): Payer: Self-pay | Admitting: *Deleted

## 2012-06-23 ENCOUNTER — Telehealth (HOSPITAL_COMMUNITY): Payer: Self-pay | Admitting: *Deleted

## 2012-06-23 NOTE — Telephone Encounter (Signed)
Preadmission screen  

## 2012-07-02 ENCOUNTER — Encounter (HOSPITAL_COMMUNITY): Payer: Self-pay

## 2012-07-02 ENCOUNTER — Inpatient Hospital Stay (HOSPITAL_COMMUNITY)
Admission: RE | Admit: 2012-07-02 | Discharge: 2012-07-05 | DRG: 373 | Disposition: A | Discharge status: Home / Self Care | Payer: BC Managed Care – PPO | Source: Ambulatory Visit | Attending: Obstetrics and Gynecology | Admitting: Obstetrics and Gynecology

## 2012-07-02 LAB — CBC
MCH: 29.7 pg (ref 26.0–34.0)
MCHC: 33.2 g/dL (ref 30.0–36.0)
MCV: 89.3 fL (ref 78.0–100.0)
Platelets: 221 10*3/uL (ref 150–400)
RDW: 13.6 % (ref 11.5–15.5)
WBC: 11.3 10*3/uL — ABNORMAL HIGH (ref 4.0–10.5)

## 2012-07-02 LAB — TYPE AND SCREEN
ABO/RH(D): A POS
Antibody Screen: NEGATIVE

## 2012-07-02 MED ORDER — TERBUTALINE SULFATE 1 MG/ML IJ SOLN: 0.2500 mg | Freq: Once | INTRAMUSCULAR | Status: AC | PRN

## 2012-07-02 MED ORDER — LIDOCAINE HCL (PF) 1 % IJ SOLN
30.0000 mL | INTRAMUSCULAR | Status: DC | PRN
  Filled 2012-07-02 (×3): qty 30

## 2012-07-02 MED ORDER — OXYTOCIN 40 UNITS IN LACTATED RINGERS INFUSION - SIMPLE MED
62.5000 mL/h | INTRAVENOUS | Status: DC
  Filled 2012-07-02: qty 1000

## 2012-07-02 MED ORDER — FLEET ENEMA 7-19 GM/118ML RE ENEM: 1.0000 | ENEMA | RECTAL | Status: DC | PRN

## 2012-07-02 MED ORDER — CITRIC ACID-SODIUM CITRATE 334-500 MG/5ML PO SOLN: 30.0000 mL | ORAL | Status: DC | PRN

## 2012-07-02 MED ORDER — OXYTOCIN BOLUS FROM INFUSION: 500.0000 mL | INTRAVENOUS | Status: DC

## 2012-07-02 MED ORDER — OXYCODONE-ACETAMINOPHEN 5-325 MG PO TABS: 1.0000 | ORAL_TABLET | ORAL | Status: DC | PRN

## 2012-07-02 MED ORDER — MISOPROSTOL 25 MCG QUARTER TABLET
25.0000 ug | ORAL_TABLET | ORAL | Status: DC | PRN
  Administered 2012-07-02: 25 ug via VAGINAL
  Filled 2012-07-02 (×2): qty 0.25
  Filled 2012-07-02: qty 1

## 2012-07-02 MED ORDER — ZOLPIDEM TARTRATE 5 MG PO TABS
5.0000 mg | ORAL_TABLET | Freq: Every evening | ORAL | Status: DC | PRN
  Administered 2012-07-02: 5 mg via ORAL
  Filled 2012-07-02: qty 1

## 2012-07-02 MED ORDER — IBUPROFEN 600 MG PO TABS: 600.0000 mg | ORAL_TABLET | Freq: Four times a day (QID) | ORAL | Status: DC | PRN

## 2012-07-02 MED ORDER — LACTATED RINGERS IV SOLN
INTRAVENOUS | Status: DC
  Administered 2012-07-02 – 2012-07-03 (×4): via INTRAVENOUS

## 2012-07-02 MED ORDER — ACETAMINOPHEN 325 MG PO TABS: 650.0000 mg | ORAL_TABLET | ORAL | Status: DC | PRN

## 2012-07-02 MED ORDER — LACTATED RINGERS IV SOLN: 500.0000 mL | INTRAVENOUS | Status: DC | PRN

## 2012-07-02 MED ORDER — ONDANSETRON HCL 4 MG/2ML IJ SOLN: 4.0000 mg | Freq: Four times a day (QID) | INTRAMUSCULAR | Status: DC | PRN

## 2012-07-03 ENCOUNTER — Encounter (HOSPITAL_COMMUNITY): Payer: Self-pay | Admitting: Anesthesiology

## 2012-07-03 ENCOUNTER — Inpatient Hospital Stay (HOSPITAL_COMMUNITY): Payer: BC Managed Care – PPO | Admitting: Anesthesiology

## 2012-07-03 ENCOUNTER — Encounter (HOSPITAL_COMMUNITY): Payer: Self-pay

## 2012-07-03 MED ORDER — DIBUCAINE 1 % RE OINT: 1.0000 "application " | TOPICAL_OINTMENT | RECTAL | Status: DC | PRN

## 2012-07-03 MED ORDER — FLEET ENEMA 7-19 GM/118ML RE ENEM: 1.0000 | ENEMA | Freq: Every day | RECTAL | Status: DC | PRN

## 2012-07-03 MED ORDER — IBUPROFEN 600 MG PO TABS
600.0000 mg | ORAL_TABLET | Freq: Four times a day (QID) | ORAL | Status: DC
  Administered 2012-07-04 – 2012-07-05 (×7): 600 mg via ORAL
  Filled 2012-07-03 (×6): qty 1

## 2012-07-03 MED ORDER — SENNOSIDES-DOCUSATE SODIUM 8.6-50 MG PO TABS
2.0000 | ORAL_TABLET | Freq: Every day | ORAL | Status: DC
  Administered 2012-07-03 – 2012-07-04 (×2): 2 via ORAL

## 2012-07-03 MED ORDER — WITCH HAZEL-GLYCERIN EX PADS: 1.0000 "application " | MEDICATED_PAD | CUTANEOUS | Status: DC | PRN

## 2012-07-03 MED ORDER — TETANUS-DIPHTH-ACELL PERTUSSIS 5-2.5-18.5 LF-MCG/0.5 IM SUSP: 0.5000 mL | Freq: Once | INTRAMUSCULAR | Status: DC

## 2012-07-03 MED ORDER — PROMETHAZINE HCL 25 MG/ML IJ SOLN
12.5000 mg | Freq: Once | INTRAMUSCULAR | Status: AC
  Administered 2012-07-03: 12.5 mg via INTRAVENOUS
  Filled 2012-07-03: qty 1

## 2012-07-03 MED ORDER — FENTANYL 2.5 MCG/ML BUPIVACAINE 1/10 % EPIDURAL INFUSION (WH - ANES)
INTRAMUSCULAR | Status: DC | PRN
  Administered 2012-07-03: 14 mL/h via EPIDURAL

## 2012-07-03 MED ORDER — LACTATED RINGERS IV SOLN
500.0000 mL | Freq: Once | INTRAVENOUS | Status: AC
  Administered 2012-07-03: 500 mL via INTRAVENOUS

## 2012-07-03 MED ORDER — DIPHENHYDRAMINE HCL 25 MG PO CAPS: 25.0000 mg | ORAL_CAPSULE | Freq: Four times a day (QID) | ORAL | Status: DC | PRN

## 2012-07-03 MED ORDER — DIPHENHYDRAMINE HCL 50 MG/ML IJ SOLN: 12.5000 mg | INTRAMUSCULAR | Status: DC | PRN

## 2012-07-03 MED ORDER — ZOLPIDEM TARTRATE 5 MG PO TABS: 5.0000 mg | ORAL_TABLET | Freq: Every evening | ORAL | Status: DC | PRN

## 2012-07-03 MED ORDER — PRENATAL MULTIVITAMIN CH
1.0000 | ORAL_TABLET | Freq: Every day | ORAL | Status: DC
  Administered 2012-07-04 – 2012-07-05 (×2): 1 via ORAL
  Filled 2012-07-03: qty 1

## 2012-07-03 MED ORDER — PHENYLEPHRINE 40 MCG/ML (10ML) SYRINGE FOR IV PUSH (FOR BLOOD PRESSURE SUPPORT)
80.0000 ug | PREFILLED_SYRINGE | INTRAVENOUS | Status: DC | PRN
  Filled 2012-07-03: qty 2
  Filled 2012-07-03: qty 5

## 2012-07-03 MED ORDER — PHENYLEPHRINE 40 MCG/ML (10ML) SYRINGE FOR IV PUSH (FOR BLOOD PRESSURE SUPPORT)
80.0000 ug | PREFILLED_SYRINGE | INTRAVENOUS | Status: DC | PRN
  Filled 2012-07-03: qty 2

## 2012-07-03 MED ORDER — BENZOCAINE-MENTHOL 20-0.5 % EX AERO
1.0000 "application " | INHALATION_SPRAY | CUTANEOUS | Status: DC | PRN
  Filled 2012-07-03: qty 56

## 2012-07-03 MED ORDER — BUTORPHANOL TARTRATE 1 MG/ML IJ SOLN
1.0000 mg | Freq: Once | INTRAMUSCULAR | Status: AC
  Administered 2012-07-03: 1 mg via INTRAVENOUS
  Filled 2012-07-03: qty 1

## 2012-07-03 MED ORDER — EPHEDRINE 5 MG/ML INJ
10.0000 mg | INTRAVENOUS | Status: AC | PRN
  Administered 2012-07-03 (×2): 10 mg via INTRAVENOUS

## 2012-07-03 MED ORDER — TERBUTALINE SULFATE 1 MG/ML IJ SOLN: 0.2500 mg | Freq: Once | INTRAMUSCULAR | Status: DC | PRN

## 2012-07-03 MED ORDER — FENTANYL 2.5 MCG/ML BUPIVACAINE 1/10 % EPIDURAL INFUSION (WH - ANES)
14.0000 mL/h | INTRAMUSCULAR | Status: DC | PRN
  Administered 2012-07-03: 14 mL/h via EPIDURAL
  Filled 2012-07-03 (×2): qty 125

## 2012-07-03 MED ORDER — LANOLIN HYDROUS EX OINT: TOPICAL_OINTMENT | CUTANEOUS | Status: DC | PRN

## 2012-07-03 MED ORDER — EPHEDRINE 5 MG/ML INJ
10.0000 mg | INTRAVENOUS | Status: DC | PRN
  Filled 2012-07-03: qty 2
  Filled 2012-07-03 (×2): qty 4

## 2012-07-03 MED ORDER — OXYCODONE-ACETAMINOPHEN 5-325 MG PO TABS: 1.0000 | ORAL_TABLET | ORAL | Status: DC | PRN

## 2012-07-03 MED ORDER — BISACODYL 10 MG RE SUPP: 10.0000 mg | Freq: Every day | RECTAL | Status: DC | PRN

## 2012-07-03 MED ORDER — ONDANSETRON HCL 4 MG PO TABS: 4.0000 mg | ORAL_TABLET | ORAL | Status: DC | PRN

## 2012-07-03 MED ORDER — OXYTOCIN 40 UNITS IN LACTATED RINGERS INFUSION - SIMPLE MED
1.0000 m[IU]/min | INTRAVENOUS | Status: DC
  Administered 2012-07-03: 2 m[IU]/min via INTRAVENOUS

## 2012-07-03 MED ORDER — LIDOCAINE HCL (PF) 1 % IJ SOLN
INTRAMUSCULAR | Status: DC | PRN
  Administered 2012-07-03 (×2): 5 mL
  Administered 2012-07-03: 2 mL

## 2012-07-03 MED ORDER — ONDANSETRON HCL 4 MG/2ML IJ SOLN: 4.0000 mg | INTRAMUSCULAR | Status: DC | PRN

## 2012-07-03 MED ORDER — SIMETHICONE 80 MG PO CHEW: 80.0000 mg | CHEWABLE_TABLET | ORAL | Status: DC | PRN

## 2012-07-03 NOTE — Progress Notes (Signed)
Patient ID: Kerry [name], female   DOB: 16-Aug-1980, 32 y.o.   MRN: [id number] Cervix 5 cm and 75 % fluid clear fhr cat 1 Begin pitocin risks discussed

## 2012-07-03 NOTE — Anesthesia Preprocedure Evaluation (Signed)
Anesthesia Evaluation  Patient identified by MRN, date of birth, ID band Patient awake    Reviewed: Allergy & Precautions, H&P , NPO status , Patient's Chart, lab work & pertinent test results  History of Anesthesia Complications Negative for: history of anesthetic complications  Airway Mallampati: II  TM Distance: >3 FB Neck ROM: full    Dental no notable dental hx. (+) Teeth Intact   Pulmonary neg pulmonary ROS,  breath sounds clear to auscultation  Pulmonary exam normal       Cardiovascular negative cardio ROS  Rhythm:regular Rate:Normal     Neuro/Psych negative neurological ROS  negative psych ROS   GI/Hepatic negative GI ROS, Neg liver ROS,   Endo/Other  negative endocrine ROSMorbid obesity  Renal/GU negative Renal ROS  negative genitourinary   Musculoskeletal   Abdominal Normal abdominal exam  (+)   Peds  Hematology negative hematology ROS (+)   Anesthesia Other Findings   Reproductive/Obstetrics (+) Pregnancy                             Anesthesia Physical Anesthesia Plan  ASA: II  Anesthesia Plan: Epidural   Post-op Pain Management:    Induction:   Airway Management Planned:   Additional Equipment:   Intra-op Plan:   Post-operative Plan:   Informed Consent: I have reviewed the patients History and Physical, chart, labs and discussed the procedure including the risks, benefits and alternatives for the proposed anesthesia with the patient or authorized representative who has indicated his/her understanding and acceptance.     Plan Discussed with:   Anesthesia Plan Comments:         Anesthesia Quick Evaluation  

## 2012-07-03 NOTE — Anesthesia Procedure Notes (Signed)
Epidural Patient location during procedure: OB  Staffing Anesthesiologist: Dorlis Judice Performed by: anesthesiologist   Preanesthetic Checklist Completed: patient identified, site marked, surgical consent, pre-op evaluation, timeout performed, IV checked, risks and benefits discussed and monitors and equipment checked  Epidural Patient position: sitting Prep: ChloraPrep Patient monitoring: heart rate, continuous pulse ox and blood pressure Approach: right paramedian Injection technique: LOR saline  Needle:  Needle type: Tuohy  Needle gauge: 17 G Needle length: 9 cm and 9 Needle insertion depth: 7 cm Catheter type: closed end flexible Catheter size: 20 Guage Catheter at skin depth: 12 cm Test dose: negative  Assessment Events: blood not aspirated, injection not painful, no injection resistance, negative IV test and no paresthesia  Additional Notes   Patient tolerated the insertion well without complications.   

## 2012-07-03 NOTE — H&P (Signed)
Kerry [name] is a 32 y.o. female presenting at 85.1 for IOL.  Uncomplicated pnc. Negative GBS. Maternal Medical History:  Prenatal complications: no prenatal complications Prenatal Complications - Diabetes: none.    OB History   Grav Para Term Preterm Abortions TAB SAB Ect Mult Living   1 0 0 0 0 0 0 0 0 0      Past Medical History  Diagnosis Date  . Smoker     1-2 CIG PER DAY  . Migraine    Past Surgical History  Procedure Laterality Date  . Appendectomy  05/2008   Family History: family history includes Alzheimer's disease in her paternal grandfather; Cancer in her maternal grandmother; Depression in her paternal grandmother; Diabetes in her maternal grandfather and maternal grandmother; Diverticulitis in her paternal grandmother; Hypertension in her maternal grandfather; and Parkinson's disease in her paternal grandfather. Social History:  reports that she quit smoking about 8 months ago. She has never used smokeless tobacco. She reports that she does not drink alcohol or use illicit drugs.   Prenatal Transfer Tool  Maternal Diabetes: No Genetic Screening: Declined Maternal Ultrasounds/Referrals: Normal Fetal Ultrasounds or other Referrals:  None Maternal Substance Abuse:  No Significant Maternal Medications:  None Significant Maternal Lab Results:  None Other Comments:  None  ROS  Dilation: 4 Effacement (%): 60 Station: -3 Exam by:: c soliz rn Blood pressure 97/56, pulse 78, temperature 98.7 F (37.1 C), temperature source Oral, resp. rate 20, height 5\' 2"  (1.575 m), weight 95.255 kg (210 lb), last menstrual period 09/16/2011. Maternal Exam:  Abdomen: Patient reports no abdominal tenderness. Fundal height is c/w dates.   Estimated fetal weight is 8.   Fetal presentation: vertex  Cervix: Cervix evaluated by digital exam.   4cm 70 % vtx -1 per nurse  Physical Exam  Prenatal labs: ABO, Rh: --/--/A POS (05/23 2030) Antibody: NEG (05/23 2030) Rubella: Immune  (10/17 0000) RPR: Nonreactive (10/17 0000)  HBsAg: Negative (10/17 0000)  HIV: Non-reactive (10/17 0000)  GBS: Negative (05/14 0000)   Assessment/Plan: Intrauterine pregnancy at 40.1 for induction of labor Routine labor and delivery   Purl Claytor S 07/03/2012, 6:53 AM

## 2012-07-04 LAB — CBC
MCV: 90 fL (ref 78.0–100.0)
Platelets: 197 10*3/uL (ref 150–400)
RBC: 2.71 MIL/uL — ABNORMAL LOW (ref 3.87–5.11)
RDW: 13.7 % (ref 11.5–15.5)
WBC: 17.1 10*3/uL — ABNORMAL HIGH (ref 4.0–10.5)

## 2012-07-04 MED ORDER — FERROUS SULFATE 325 (65 FE) MG PO TABS
325.0000 mg | ORAL_TABLET | Freq: Two times a day (BID) | ORAL | Status: DC
  Administered 2012-07-04 – 2012-07-05 (×2): 325 mg via ORAL
  Filled 2012-07-04 (×2): qty 1

## 2012-07-04 NOTE — Lactation Note (Signed)
This note was copied from the chart of Girl Amesha Bailey. Lactation Consultation Note  Patient Name: Girl Keyona Emrich Today's Date: 07/04/2012 Reason for consult: Initial assessment   Maternal Data Formula Feeding for Exclusion: No Infant to breast within first hour of birth: Yes Does the patient have breastfeeding experience prior to this delivery?: No  Feeding Feeding Type: Breast Milk Feeding method: Breast  LATCH Score/Interventions Latch: Grasps breast easily, tongue down, lips flanged, rhythmical sucking.  Audible Swallowing: A few with stimulation  Type of Nipple: Flat  Comfort (Breast/Nipple): Soft / non-tender     Hold (Positioning): Assistance needed to correctly position infant at breast and maintain latch. Intervention(s): Breastfeeding basics reviewed;Support Pillows;Skin to skin  LATCH Score: 7  Lactation Tools Discussed/Used     Consult Status Consult Status: Follow-up Date: 07/05/12 Follow-up type: In-patient  Dad skin to skin with dad and she is rooting and showing feeding cues. Assisted mom with football hold and baby latched well but wanted to slide to tip of nipple. Relatched and mom reports that feels better. Reviewed signs of a good latch- wide open mouth and having the baby deep onto the breast. Nipple slightly pinched when baby come off the breast.Latched on left breast and mom reports that feels better. Encouraged to take baby off and try again if she feels she is too close to the nipple. BF brochure given with resources for support after DC discussed. Discussed feeding cues and cluster feeding and encouraged to take a nap this afternoon.No questions at present.To call for assist prn  Pamelia Hoit 07/04/2012, 1:46 PM

## 2012-07-04 NOTE — Anesthesia Postprocedure Evaluation (Signed)
Anesthesia Post Note  Patient: Kerry [name]  Procedure(s) Performed: * No procedures listed *  Anesthesia type: Epidural  Patient location: Mother/Baby  Post pain: Pain level controlled  Post assessment: Post-op Vital signs reviewed  Last Vitals:  Filed Vitals:   07/04/12 0600  BP: 90/55  Pulse: 81  Temp: 37 C  Resp: 18    Post vital signs: Reviewed  Level of consciousness:alert  Complications: No apparent anesthesia complications

## 2012-07-04 NOTE — Progress Notes (Signed)
Post Partum Day one Subjective: no complaints  Objective: Blood pressure 90/55, pulse 81, temperature 98.6 F (37 C), temperature source Oral, resp. rate 18, height 5\' 2"  (1.575 m), weight 95.255 kg (210 lb), last menstrual period 09/16/2011, SpO2 98.00%, unknown if currently breastfeeding.  Physical Exam:  General: alert Lochia: appropriate Uterine Fundus: firm Incision: healing well DVT Evaluation: No evidence of DVT seen on physical exam.   Recent Labs  07/02/12 2030 07/04/12 0604  HGB 11.4* 8.2*  HCT 34.3* 24.4*    Assessment/Plan: Plan for discharge tomorrow   LOS: 2 days   Gabriele Loveland S 07/04/2012, 9:21 AM

## 2012-07-05 LAB — CBC
Hemoglobin: 7.6 g/dL — ABNORMAL LOW (ref 12.0–15.0)
MCHC: 33.5 g/dL (ref 30.0–36.0)
WBC: 14.9 10*3/uL — ABNORMAL HIGH (ref 4.0–10.5)

## 2012-07-05 MED ORDER — IBUPROFEN 600 MG PO TABS: 600.0000 mg | ORAL_TABLET | Freq: Four times a day (QID) | ORAL | Status: DC

## 2012-07-05 NOTE — Discharge Summary (Signed)
Obstetric Discharge Summary Reason for Admission: induction of labor Prenatal Procedures: none Intrapartum Procedures: spontaneous vaginal delivery Postpartum Procedures: none Complications-Operative and Postpartum: none Hemoglobin  Date Value Range Status  07/05/2012 7.6* 12.0 - 15.0 g/dL Final     HCT  Date Value Range Status  07/05/2012 22.7* 36.0 - 46.0 % Final    Physical Exam:  General: alert, cooperative, appears stated age and no distress Lochia: appropriate Uterine Fundus: firm Incision: healing well DVT Evaluation: No evidence of DVT seen on physical exam.  Discharge Diagnoses: Term Pregnancy-delivered  Discharge Information: Date: 07/05/2012 Activity: pelvic rest Diet: routine Medications: None and Ibuprofen Condition: stable Instructions: refer to practice specific booklet Discharge to: home   Newborn Data: Live born female  Birth Weight: 7 lb 2.5 oz (3246 g) APGAR: 9, 9  Home with mother.  Kynisha Memon C 07/05/2012, 9:53 AM

## 2012-12-16 ENCOUNTER — Other Ambulatory Visit: Payer: Self-pay

## 2013-08-02 ENCOUNTER — Other Ambulatory Visit: Payer: Self-pay | Admitting: Obstetrics & Gynecology

## 2013-08-02 DIAGNOSIS — R52 Pain, unspecified: Secondary | ICD-10-CM

## 2013-08-02 DIAGNOSIS — L539 Erythematous condition, unspecified: Secondary | ICD-10-CM

## 2013-08-02 DIAGNOSIS — N6452 Nipple discharge: Secondary | ICD-10-CM

## 2013-08-02 DIAGNOSIS — N644 Mastodynia: Secondary | ICD-10-CM

## 2013-08-11 ENCOUNTER — Ambulatory Visit
Admission: RE | Admit: 2013-08-11 | Discharge: 2013-08-11 | Disposition: A | Discharge status: Home / Self Care | Payer: BC Managed Care – PPO | Source: Ambulatory Visit | Attending: Obstetrics & Gynecology | Admitting: Obstetrics & Gynecology

## 2013-08-11 DIAGNOSIS — L539 Erythematous condition, unspecified: Secondary | ICD-10-CM

## 2013-08-11 DIAGNOSIS — N6452 Nipple discharge: Secondary | ICD-10-CM

## 2013-08-11 DIAGNOSIS — R52 Pain, unspecified: Secondary | ICD-10-CM

## 2013-08-11 DIAGNOSIS — N644 Mastodynia: Secondary | ICD-10-CM

## 2013-12-12 ENCOUNTER — Encounter (HOSPITAL_COMMUNITY): Payer: Self-pay

## 2014-09-07 ENCOUNTER — Other Ambulatory Visit: Payer: Self-pay | Admitting: Obstetrics & Gynecology

## 2014-09-08 LAB — CYTOLOGY - PAP

## 2015-09-25 NOTE — H&P (Signed)
Kerry [name] is an 35 y.o. female with missed abortion at 9 weeks.  Patient was offered expectant, medical and surgical treatment and elected to proceed with D&C.  Blood type A pos.  Pertinent Gynecological History: Menses: pregnant Bleeding: n/a Contraception: none DES exposure: unknown Blood transfusions: none Sexually transmitted diseases: no past history Previous GYN Procedures: none  Last mammogram: n/a Date: n/a Last pap: normal Date: 2016 OB History: G2, P1011   Menstrual History: Menarche age: n/a No LMP recorded.    Past Medical History:  Diagnosis Date  . Migraine   . Smoker    1-2 CIG PER DAY    Past Surgical History:  Procedure Laterality Date  . APPENDECTOMY  05/2008    Family History  Problem Relation Age of Onset  . Diabetes Maternal Grandmother   . Cancer Maternal Grandmother     pancreas  . Diabetes Maternal Grandfather   . Hypertension Maternal Grandfather   . Diverticulitis Paternal Grandmother   . Depression Paternal Grandmother   . Alzheimer's disease Paternal Grandfather   . Parkinson's disease Paternal Grandfather     Social History:  reports that she quit smoking about 3 years ago. She has never used smokeless tobacco. She reports that she does not drink alcohol or use drugs.  Allergies: No Known Allergies  No prescriptions prior to admission.    ROS  unknown if currently breastfeeding. Physical Exam  Constitutional: She is oriented to person, place, and time. She appears well-developed and well-nourished.  Cardiovascular: Normal rate and regular rhythm.   Respiratory: Effort normal and breath sounds normal.  GI: Soft. There is no rebound and no guarding.  Neurological: She is alert and oriented to person, place, and time.  Skin: Skin is warm and dry.  Psychiatric: She has a normal mood and affect. Her behavior is normal.    No results found for this or any previous visit (from the past 24 hour(s)).  No results  found.  Assessment/Plan: 34yo G2P1 with missed ab -Suction D&C -Patient has been counseled re: risk of bleeding, infection, scarring, and damage to surrounding structures.  All questions were answered and the patient wishes to proceed.  Kerry [name] 09/25/2015, 7:03 PM

## 2015-09-26 ENCOUNTER — Encounter (HOSPITAL_COMMUNITY): Payer: Self-pay | Admitting: *Deleted

## 2015-09-26 MED ORDER — DOXYCYCLINE HYCLATE 100 MG IV SOLR
100.0000 mg | Freq: Once | INTRAVENOUS | Status: AC
  Administered 2015-09-27: 100 mg via INTRAVENOUS
  Filled 2015-09-26: qty 100

## 2015-09-27 ENCOUNTER — Ambulatory Visit (HOSPITAL_COMMUNITY)
Admission: RE | Admit: 2015-09-27 | Discharge: 2015-09-27 | Disposition: A | Discharge status: Home / Self Care | Payer: BLUE CROSS/BLUE SHIELD | Source: Ambulatory Visit | Attending: Obstetrics & Gynecology | Admitting: Obstetrics & Gynecology

## 2015-09-27 ENCOUNTER — Ambulatory Visit (HOSPITAL_COMMUNITY): Payer: BLUE CROSS/BLUE SHIELD | Admitting: Anesthesiology

## 2015-09-27 ENCOUNTER — Encounter (HOSPITAL_COMMUNITY): Payer: Self-pay

## 2015-09-27 ENCOUNTER — Encounter (HOSPITAL_COMMUNITY)
Admission: RE | Disposition: A | Discharge status: Home / Self Care | Payer: Self-pay | Source: Ambulatory Visit | Attending: Obstetrics & Gynecology

## 2015-09-27 DIAGNOSIS — Z87891 Personal history of nicotine dependence: Secondary | ICD-10-CM | POA: Insufficient documentation

## 2015-09-27 DIAGNOSIS — O021 Missed abortion: Secondary | ICD-10-CM | POA: Diagnosis not present

## 2015-09-27 HISTORY — PX: DILATION AND EVACUATION: SHX1459

## 2015-09-27 LAB — CBC
HCT: 35.9 % — ABNORMAL LOW (ref 36.0–46.0)
Hemoglobin: 12.6 g/dL (ref 12.0–15.0)
MCH: 30.1 pg (ref 26.0–34.0)
MCHC: 35.1 g/dL (ref 30.0–36.0)
MCV: 85.9 fL (ref 78.0–100.0)
PLATELETS: 266 10*3/uL (ref 150–400)
RBC: 4.18 MIL/uL (ref 3.87–5.11)
RDW: 12.6 % (ref 11.5–15.5)
WBC: 8.2 10*3/uL (ref 4.0–10.5)

## 2015-09-27 SURGERY — DILATION AND EVACUATION, UTERUS
Anesthesia: Monitor Anesthesia Care | Site: Vagina

## 2015-09-27 MED ORDER — KETOROLAC TROMETHAMINE 30 MG/ML IJ SOLN: 30.0000 mg | Freq: Once | INTRAMUSCULAR | Status: DC | PRN

## 2015-09-27 MED ORDER — KETOROLAC TROMETHAMINE 30 MG/ML IJ SOLN
INTRAMUSCULAR | Status: DC | PRN
  Administered 2015-09-27: 30 mg via INTRAVENOUS

## 2015-09-27 MED ORDER — MEPERIDINE HCL 25 MG/ML IJ SOLN: 6.2500 mg | INTRAMUSCULAR | Status: DC | PRN

## 2015-09-27 MED ORDER — SCOPOLAMINE 1 MG/3DAYS TD PT72
1.0000 | MEDICATED_PATCH | Freq: Once | TRANSDERMAL | Status: DC
  Administered 2015-09-27: 1.5 mg via TRANSDERMAL

## 2015-09-27 MED ORDER — HYDROCODONE-ACETAMINOPHEN 7.5-325 MG PO TABS: 1.0000 | ORAL_TABLET | Freq: Once | ORAL | Status: DC | PRN

## 2015-09-27 MED ORDER — FENTANYL CITRATE (PF) 100 MCG/2ML IJ SOLN
INTRAMUSCULAR | Status: AC
  Filled 2015-09-27: qty 2

## 2015-09-27 MED ORDER — PROPOFOL 10 MG/ML IV BOLUS
INTRAVENOUS | Status: AC
  Filled 2015-09-27: qty 20

## 2015-09-27 MED ORDER — FENTANYL CITRATE (PF) 100 MCG/2ML IJ SOLN: 25.0000 ug | INTRAMUSCULAR | Status: DC | PRN

## 2015-09-27 MED ORDER — OXYCODONE-ACETAMINOPHEN 5-325 MG PO TABS: 1.0000 | ORAL_TABLET | ORAL | 0 refills | Status: DC | PRN

## 2015-09-27 MED ORDER — CHLOROPROCAINE HCL 1 % IJ SOLN
INTRAMUSCULAR | Status: DC | PRN
  Administered 2015-09-27: 10 mL

## 2015-09-27 MED ORDER — DOXYCYCLINE HYCLATE 100 MG PO TABS
200.0000 mg | ORAL_TABLET | Freq: Once | ORAL | Status: AC
  Administered 2015-09-27: 200 mg via ORAL

## 2015-09-27 MED ORDER — ONDANSETRON HCL 4 MG/2ML IJ SOLN
INTRAMUSCULAR | Status: DC | PRN
Start: 2015-09-27 — End: 2015-09-27
  Administered 2015-09-27: 4 mg via INTRAVENOUS

## 2015-09-27 MED ORDER — MIDAZOLAM HCL 2 MG/2ML IJ SOLN
INTRAMUSCULAR | Status: DC | PRN
  Administered 2015-09-27: 2 mg via INTRAVENOUS

## 2015-09-27 MED ORDER — KETOROLAC TROMETHAMINE 30 MG/ML IJ SOLN
INTRAMUSCULAR | Status: AC
  Filled 2015-09-27: qty 1

## 2015-09-27 MED ORDER — FENTANYL CITRATE (PF) 100 MCG/2ML IJ SOLN
INTRAMUSCULAR | Status: DC | PRN
  Administered 2015-09-27: 100 ug via INTRAVENOUS

## 2015-09-27 MED ORDER — PROPOFOL 500 MG/50ML IV EMUL
INTRAVENOUS | Status: DC | PRN
  Administered 2015-09-27 (×4): 25 mg via INTRAVENOUS
  Administered 2015-09-27: 50 mg via INTRAVENOUS
  Administered 2015-09-27 (×3): 25 mg via INTRAVENOUS

## 2015-09-27 MED ORDER — LACTATED RINGERS IV SOLN: INTRAVENOUS | Status: DC

## 2015-09-27 MED ORDER — DEXAMETHASONE SODIUM PHOSPHATE 10 MG/ML IJ SOLN
INTRAMUSCULAR | Status: DC | PRN
  Administered 2015-09-27: 10 mg via INTRAVENOUS

## 2015-09-27 MED ORDER — SCOPOLAMINE 1 MG/3DAYS TD PT72
MEDICATED_PATCH | TRANSDERMAL | Status: AC
  Administered 2015-09-27: 1.5 mg via TRANSDERMAL
  Filled 2015-09-27: qty 1

## 2015-09-27 MED ORDER — CHLOROPROCAINE HCL 1 % IJ SOLN
INTRAMUSCULAR | Status: AC
Start: 2015-09-27 — End: 2015-09-27
  Filled 2015-09-27: qty 30

## 2015-09-27 MED ORDER — PROMETHAZINE HCL 25 MG/ML IJ SOLN: 6.2500 mg | INTRAMUSCULAR | Status: DC | PRN

## 2015-09-27 MED ORDER — LIDOCAINE HCL (CARDIAC) 20 MG/ML IV SOLN
INTRAVENOUS | Status: AC
  Filled 2015-09-27: qty 5

## 2015-09-27 MED ORDER — LIDOCAINE HCL (CARDIAC) 20 MG/ML IV SOLN
INTRAVENOUS | Status: DC | PRN
  Administered 2015-09-27: 50 mg via INTRAVENOUS

## 2015-09-27 MED ORDER — DOXYCYCLINE HYCLATE 100 MG PO TABS
ORAL_TABLET | ORAL | Status: AC
  Filled 2015-09-27: qty 2

## 2015-09-27 MED ORDER — ONDANSETRON HCL 4 MG/2ML IJ SOLN
INTRAMUSCULAR | Status: AC
  Filled 2015-09-27: qty 2

## 2015-09-27 MED ORDER — HYDROMORPHONE HCL 1 MG/ML IJ SOLN: 0.2500 mg | INTRAMUSCULAR | Status: DC | PRN

## 2015-09-27 MED ORDER — LACTATED RINGERS IV SOLN
INTRAVENOUS | Status: DC
  Administered 2015-09-27 (×2): via INTRAVENOUS

## 2015-09-27 MED ORDER — MIDAZOLAM HCL 2 MG/2ML IJ SOLN
INTRAMUSCULAR | Status: AC
  Filled 2015-09-27: qty 2

## 2015-09-27 SURGICAL SUPPLY — 20 items
CATH ROBINSON RED A/P 16FR (CATHETERS) ×3 IMPLANT
CLOTH BEACON ORANGE TIMEOUT ST (SAFETY) ×3 IMPLANT
DECANTER SPIKE VIAL GLASS SM (MISCELLANEOUS) ×3 IMPLANT
GLOVE BIO SURGEON STRL SZ 6 (GLOVE) ×3 IMPLANT
GLOVE BIOGEL PI IND STRL 6 (GLOVE) ×2 IMPLANT
GLOVE BIOGEL PI IND STRL 7.0 (GLOVE) ×1 IMPLANT
GLOVE BIOGEL PI INDICATOR 6 (GLOVE) ×4
GLOVE BIOGEL PI INDICATOR 7.0 (GLOVE) ×2
GOWN STRL REUS W/TWL LRG LVL3 (GOWN DISPOSABLE) ×6 IMPLANT
KIT BERKELEY 1ST TRIMESTER 3/8 (MISCELLANEOUS) ×3 IMPLANT
NS IRRIG 1000ML POUR BTL (IV SOLUTION) ×3 IMPLANT
PACK VAGINAL MINOR WOMEN LF (CUSTOM PROCEDURE TRAY) ×3 IMPLANT
PAD OB MATERNITY 4.3X12.25 (PERSONAL CARE ITEMS) ×3 IMPLANT
PAD PREP 24X48 CUFFED NSTRL (MISCELLANEOUS) ×3 IMPLANT
SET BERKELEY SUCTION TUBING (SUCTIONS) ×3 IMPLANT
TOWEL OR 17X24 6PK STRL BLUE (TOWEL DISPOSABLE) ×6 IMPLANT
VACURETTE 10 RIGID CVD (CANNULA) IMPLANT
VACURETTE 7MM CVD STRL WRAP (CANNULA) IMPLANT
VACURETTE 8 RIGID CVD (CANNULA) IMPLANT
VACURETTE 9 RIGID CVD (CANNULA) ×3 IMPLANT

## 2015-09-27 NOTE — Progress Notes (Signed)
No change to H&P.  Catie Chiao, DO 

## 2015-09-27 NOTE — Transfer of Care (Signed)
Immediate Anesthesia Transfer of Care Note  Patient: Kerry [name]  Procedure(s) Performed: Procedure(s): DILATATION AND EVACUATION (N/A)  Patient Location: PACU  Anesthesia Type:MAC  Level of Consciousness: awake, alert , oriented and patient cooperative  Airway & Oxygen Therapy: Patient Spontanous Breathing and Patient connected to nasal cannula oxygen  Post-op Assessment: Report given to RN, Post -op Vital signs reviewed and stable and Patient moving all extremities X 4  Post vital signs: Reviewed and stable  Last Vitals:  Vitals:   09/27/15 1052  BP: 111/82  Pulse: 82  Resp: 16  Temp: 36.8 C    Last Pain:  Vitals:   09/27/15 1052  TempSrc: Oral      Patients Stated Pain Goal: 3 (09/27/15 1052)  Complications: No apparent anesthesia complications

## 2015-09-27 NOTE — Op Note (Signed)
PREOPERATIVE DIAGNOSIS: 35 y.o. with missed abortion measuring 9 weeks  POSTOPERATIVE DIAGNOSIS: The same  PROCEDURE: Suction Dilation and Curettage   SURGEON: Dr. Mitchel HonourMegan Rayansh Herbst  INDICATIONS: 35 y.o. here for scheduled surgery for treatment of missed abortion Risks of surgery were discussed with the patient including but not limited to: bleeding which may require transfusion; infection which may require antibiotics; injury to uterus or surrounding organs; intrauterine scarring which may impair future fertility; need for additional procedures including laparotomy or laparoscopy; and other postoperative/anesthesia complications. Written informed consent was obtained.   FINDINGS: A 12 week size uterus.   ANESTHESIA:LMA, paracervical block  ESTIMATED BLOOD LOSS:25cc  SPECIMENS: POC sent to pathology   COMPLICATIONS: None immediate.  PROCEDURE DETAILS: The patient received intravenous antibiotics while in the preoperative area. She was then taken to the operating room where conscious sedation was administered and was found to be adequate. After an adequate timeout was performed, she was placed in the dorsal lithotomy position and examined; then prepped and draped in the sterile manner. Her bladder was catheterized for an unmeasured amount of clear, yellow urine. A speculum was then placed in the patient's vagina and a single tooth tenaculum was applied to the anterior lip of the cervix.Using 10cc 1% nesacaine, paracervical block was performed. The uterus was dilated manually with metal dilators to accommodate the 9 mm suction curette. Once the cervix was dilated, the suction curette was advanced without difficulty. A total of two passes were taken with tissue returned. A sharp curette was used to feel for gritty texture circumferentially which was present. A final pass with the suction curette was performed to remove blood. The tenaculum was removed from the anterior lip  of the cervix and the vaginal speculum was removed after noting good hemostasis. The patient tolerated the procedure well and was taken to the recovery area awake, extubated and in stable condition.

## 2015-09-27 NOTE — Discharge Instructions (Addendum)
DISCHARGE INSTRUCTIONS: D&C / D&E The following instructions have been prepared to help you care for yourself upon your return home.   Personal hygiene:  Use sanitary pads for vaginal drainage, not tampons.  Shower the day after your procedure.  NO tub baths, pools or Jacuzzis for 2-3 weeks.  Wipe front to back after using the bathroom.  Activity and limitations:  Do NOT drive or operate any equipment for 24 hours. The effects of anesthesia are still present and drowsiness may result.  Do NOT rest in bed all day.  Walking is encouraged.  Walk up and down stairs slowly.  You may resume your normal activity in one to two days or as indicated by your physician.  Sexual activity: NO intercourse for at least 2 weeks after the procedure, or as indicated by your physician.  Diet: Eat a light meal as desired this evening. You may resume your usual diet tomorrow.  Return to work: You may resume your work activities in one to two days or as indicated by your doctor.  What to expect after your surgery: Expect to have vaginal bleeding/discharge for 2-3 days and spotting for up to 10 days. It is not unusual to have soreness for up to 1-2 weeks. You may have a slight burning sensation when you urinate for the first day. Mild cramps may continue for a couple of days. You may have a regular period in 2-6 weeks.  Call your doctor for any of the following:  Excessive vaginal bleeding, saturating and changing one pad every hour.  Inability to urinate 6 hours after discharge from hospital.  Pain not relieved by pain medication.  Fever of 100.4 F or greater.  Unusual vaginal discharge or odor.   Call for an appointment:    Patients signature: ______________________  Nurses signature ________________________  Support person's signature_______________________    Post Anesthesia Home Care Instructions  BO IBUPROFEN PRODUCTS UNTIL: 7 PM TODAY  Activity: Get plenty of rest for  the remainder of the day. A responsible adult should stay with you for 24 hours following the procedure.  For the next 24 hours, DO NOT: -Drive a car -Advertising copywriterperate machinery -Drink alcoholic beverages -Take any medication unless instructed by your physician -Make any legal decisions or sign important papers.  Meals: Start with liquid foods such as gelatin or soup. Progress to regular foods as tolerated. Avoid greasy, spicy, heavy foods. If nausea and/or vomiting occur, drink only clear liquids until the nausea and/or vomiting subsides. Call your physician if vomiting continues.  Special Instructions/Symptoms: Your throat may feel dry or sore from the anesthesia or the breathing tube placed in your throat during surgery. If this causes discomfort, gargle with warm salt water. The discomfort should disappear within 24 hours.  If you had a scopolamine patch placed behind your ear for the management of post- operative nausea and/or vomiting:  1. The medication in the patch is effective for 72 hours, after which it should be removed.  Wrap patch in a tissue and discard in the trash. Wash hands thoroughly with soap and water. 2. You may remove the patch earlier than 72 hours if you experience unpleasant side effects which may include dry mouth, dizziness or visual disturbances. 3. Avoid touching the patch. Wash your hands with soap and water after contact with the patch.   Call MD for T>100.4, heavy vaginal bleeding, severe abdominal pain, intractable nausea and/or vomiting, or respiratory distress.  Pelvic rest x 2 weeks.  No driving while taking  narcotics.  Follow up in the office in 2 weeks for postop visit.

## 2015-09-27 NOTE — Anesthesia Preprocedure Evaluation (Addendum)
Anesthesia Evaluation  Patient identified by MRN, date of birth, ID band Patient awake    Reviewed: Allergy & Precautions, NPO status , Patient's Chart, lab work & pertinent test results  Airway Mallampati: I  TM Distance: >3 FB Neck ROM: Full    Dental   Pulmonary neg pulmonary ROS, former smoker,    breath sounds clear to auscultation       Cardiovascular negative cardio ROS   Rhythm:Regular Rate:Normal     Neuro/Psych  Headaches, negative psych ROS   GI/Hepatic negative GI ROS, Neg liver ROS,   Endo/Other  negative endocrine ROS  Renal/GU negative Renal ROS  negative genitourinary   Musculoskeletal negative musculoskeletal ROS (+)   Abdominal   Peds negative pediatric ROS (+)  Hematology negative hematology ROS (+)   Anesthesia Other Findings   Reproductive/Obstetrics negative OB ROS                            Lab Results  Component Value Date   WBC 8.2 09/27/2015   HGB 12.6 09/27/2015   HCT 35.9 (L) 09/27/2015   MCV 85.9 09/27/2015   PLT 266 09/27/2015     Anesthesia Physical Anesthesia Plan  ASA: II  Anesthesia Plan: MAC   Post-op Pain Management:    Induction: Intravenous  Airway Management Planned: Natural Airway  Additional Equipment:   Intra-op Plan:   Post-operative Plan:   Informed Consent: I have reviewed the patients History and Physical, chart, labs and discussed the procedure including the risks, benefits and alternatives for the proposed anesthesia with the patient or authorized representative who has indicated his/her understanding and acceptance.   Dental advisory given  Plan Discussed with: CRNA  Anesthesia Plan Comments:        Anesthesia Quick Evaluation

## 2015-09-27 NOTE — Anesthesia Postprocedure Evaluation (Signed)
Anesthesia Post Note  Patient: Kerry [name] Tech  Procedure(s) Performed: Procedure(s) (LRB): DILATATION AND EVACUATION (N/A)  Patient location during evaluation: PACU Anesthesia Type: MAC Level of consciousness: awake and alert Pain management: pain level controlled Vital Signs Assessment: post-procedure vital signs reviewed and stable Respiratory status: spontaneous breathing, nonlabored ventilation, respiratory function stable and patient connected to nasal cannula oxygen Cardiovascular status: stable and blood pressure returned to baseline Anesthetic complications: no     Last Vitals:  Vitals:   09/27/15 1400 09/27/15 1435  BP: (!) 90/51 106/71  Pulse: 76 64  Resp: 16 16  Temp: 36.6 C 36.7 C    Last Pain:  Vitals:   09/27/15 1435  TempSrc:   PainSc: 0-No pain   Pain Goal: Patients Stated Pain Goal: 3 (09/27/15 1400)               Kennieth RadFitzgerald, Bevely Hackbart [name]

## 2015-09-28 ENCOUNTER — Encounter (HOSPITAL_COMMUNITY): Payer: Self-pay | Admitting: Obstetrics & Gynecology

## 2016-12-16 LAB — OB RESULTS CONSOLE HEPATITIS B SURFACE ANTIGEN: HEP B S AG: NEGATIVE

## 2016-12-16 LAB — OB RESULTS CONSOLE RUBELLA ANTIBODY, IGM: Rubella: UNDETERMINED

## 2016-12-16 LAB — OB RESULTS CONSOLE HIV ANTIBODY (ROUTINE TESTING): HIV: NONREACTIVE

## 2016-12-16 LAB — OB RESULTS CONSOLE GC/CHLAMYDIA
CHLAMYDIA, DNA PROBE: NEGATIVE
GC PROBE AMP, GENITAL: NEGATIVE

## 2016-12-16 LAB — OB RESULTS CONSOLE ABO/RH: RH Type: POSITIVE

## 2016-12-16 LAB — OB RESULTS CONSOLE ANTIBODY SCREEN: ANTIBODY SCREEN: NEGATIVE

## 2016-12-16 LAB — OB RESULTS CONSOLE RPR: RPR: NONREACTIVE

## 2017-02-10 NOTE — L&D Delivery Note (Signed)
Delivery Note At 4:34 PM a viable female was delivered via Vaginal, Spontaneous (Presentation: LOA ).  APGAR: 9, 9; weight pending.   Placenta status: S, I. 3V Cord with the following complications: loose nuchal x 1.  Cord pH: n/a  Anesthesia:  CLEA Episiotomy: None Lacerations: Right Labial to Vaginal sulcus;1st degree Suture Repair: 3.0 vicryl rapide Est. Blood Loss (mL):  500  Mom to postpartum.  Baby to Couplet care / Skin to Skin.  Claudie Brickhouse 07/15/2017, 5:10 PM

## 2017-04-15 LAB — OB RESULTS CONSOLE HIV ANTIBODY (ROUTINE TESTING): HIV: NONREACTIVE

## 2017-06-23 LAB — OB RESULTS CONSOLE GBS: GBS: NEGATIVE

## 2017-07-01 ENCOUNTER — Encounter (HOSPITAL_COMMUNITY): Payer: Self-pay | Admitting: *Deleted

## 2017-07-01 ENCOUNTER — Telehealth (HOSPITAL_COMMUNITY): Payer: Self-pay | Admitting: *Deleted

## 2017-07-02 ENCOUNTER — Encounter (HOSPITAL_COMMUNITY): Payer: Self-pay | Admitting: *Deleted

## 2017-07-02 LAB — OB RESULTS CONSOLE GBS: STREP GROUP B AG: NEGATIVE

## 2017-07-02 NOTE — Telephone Encounter (Signed)
Preadmission screen  

## 2017-07-13 ENCOUNTER — Inpatient Hospital Stay (HOSPITAL_COMMUNITY): Payer: BLUE CROSS/BLUE SHIELD

## 2017-07-15 ENCOUNTER — Inpatient Hospital Stay (HOSPITAL_COMMUNITY)
Admission: RE | Admit: 2017-07-15 | Discharge: 2017-07-16 | DRG: 807 | Disposition: A | Discharge status: Home / Self Care | Payer: BLUE CROSS/BLUE SHIELD | Attending: Obstetrics & Gynecology | Admitting: Obstetrics & Gynecology

## 2017-07-15 ENCOUNTER — Encounter (HOSPITAL_COMMUNITY): Payer: Self-pay

## 2017-07-15 ENCOUNTER — Inpatient Hospital Stay (HOSPITAL_COMMUNITY): Payer: BLUE CROSS/BLUE SHIELD | Admitting: Anesthesiology

## 2017-07-15 DIAGNOSIS — Z3483 Encounter for supervision of other normal pregnancy, third trimester: Secondary | ICD-10-CM | POA: Diagnosis present

## 2017-07-15 DIAGNOSIS — Z87891 Personal history of nicotine dependence: Secondary | ICD-10-CM | POA: Diagnosis not present

## 2017-07-15 DIAGNOSIS — Z3A39 39 weeks gestation of pregnancy: Secondary | ICD-10-CM

## 2017-07-15 DIAGNOSIS — Z3490 Encounter for supervision of normal pregnancy, unspecified, unspecified trimester: Secondary | ICD-10-CM

## 2017-07-15 LAB — CBC
HEMATOCRIT: 32 % — AB (ref 36.0–46.0)
HEMOGLOBIN: 10.7 g/dL — AB (ref 12.0–15.0)
MCH: 30.3 pg (ref 26.0–34.0)
MCHC: 33.4 g/dL (ref 30.0–36.0)
MCV: 90.7 fL (ref 78.0–100.0)
Platelets: 199 10*3/uL (ref 150–400)
RBC: 3.53 MIL/uL — ABNORMAL LOW (ref 3.87–5.11)
RDW: 14.5 % (ref 11.5–15.5)
WBC: 10.2 10*3/uL (ref 4.0–10.5)

## 2017-07-15 LAB — TYPE AND SCREEN
ABO/RH(D): A POS
ANTIBODY SCREEN: NEGATIVE

## 2017-07-15 LAB — RPR: RPR Ser Ql: NONREACTIVE

## 2017-07-15 MED ORDER — COCONUT OIL OIL
1.0000 "application " | TOPICAL_OIL | Status: DC | PRN
  Administered 2017-07-16: 1 via TOPICAL
  Filled 2017-07-15 (×2): qty 120

## 2017-07-15 MED ORDER — ACETAMINOPHEN 325 MG PO TABS: 650.0000 mg | ORAL_TABLET | ORAL | Status: DC | PRN

## 2017-07-15 MED ORDER — LACTATED RINGERS IV SOLN
500.0000 mL | Freq: Once | INTRAVENOUS | Status: AC
  Administered 2017-07-15: 500 mL via INTRAVENOUS

## 2017-07-15 MED ORDER — FENTANYL 2.5 MCG/ML BUPIVACAINE 1/10 % EPIDURAL INFUSION (WH - ANES)
14.0000 mL/h | INTRAMUSCULAR | Status: DC | PRN
  Administered 2017-07-15 (×2): 14 mL/h via EPIDURAL
  Filled 2017-07-15: qty 100

## 2017-07-15 MED ORDER — ACETAMINOPHEN 325 MG PO TABS
650.0000 mg | ORAL_TABLET | ORAL | Status: DC | PRN
Start: 2017-07-15 — End: 2017-07-16

## 2017-07-15 MED ORDER — EPHEDRINE 5 MG/ML INJ: 10.0000 mg | INTRAVENOUS | Status: DC | PRN

## 2017-07-15 MED ORDER — SENNOSIDES-DOCUSATE SODIUM 8.6-50 MG PO TABS
2.0000 | ORAL_TABLET | ORAL | Status: DC
  Administered 2017-07-15: 2 via ORAL
  Filled 2017-07-15: qty 2

## 2017-07-15 MED ORDER — DIPHENHYDRAMINE HCL 50 MG/ML IJ SOLN: 12.5000 mg | INTRAMUSCULAR | Status: DC | PRN

## 2017-07-15 MED ORDER — TETANUS-DIPHTH-ACELL PERTUSSIS 5-2.5-18.5 LF-MCG/0.5 IM SUSP
0.5000 mL | Freq: Once | INTRAMUSCULAR | Status: DC
  Filled 2017-07-15: qty 0.5

## 2017-07-15 MED ORDER — SOD CITRATE-CITRIC ACID 500-334 MG/5ML PO SOLN: 30.0000 mL | ORAL | Status: DC | PRN

## 2017-07-15 MED ORDER — DIBUCAINE 1 % RE OINT
1.0000 "application " | TOPICAL_OINTMENT | RECTAL | Status: DC | PRN
  Filled 2017-07-15: qty 28

## 2017-07-15 MED ORDER — LACTATED RINGERS IV SOLN
INTRAVENOUS | Status: DC
  Administered 2017-07-15 (×2): via INTRAVENOUS

## 2017-07-15 MED ORDER — LIDOCAINE-EPINEPHRINE (PF) 2 %-1:200000 IJ SOLN
INTRAMUSCULAR | Status: DC | PRN
  Administered 2017-07-15: 8 mL via EPIDURAL

## 2017-07-15 MED ORDER — FENTANYL CITRATE (PF) 100 MCG/2ML IJ SOLN
50.0000 ug | INTRAMUSCULAR | Status: DC | PRN
  Administered 2017-07-15: 100 ug via INTRAVENOUS
  Filled 2017-07-15: qty 2

## 2017-07-15 MED ORDER — ONDANSETRON HCL 4 MG/2ML IJ SOLN
4.0000 mg | Freq: Four times a day (QID) | INTRAMUSCULAR | Status: DC | PRN
  Administered 2017-07-15: 4 mg via INTRAVENOUS
  Filled 2017-07-15: qty 2

## 2017-07-15 MED ORDER — OXYCODONE-ACETAMINOPHEN 5-325 MG PO TABS: 1.0000 | ORAL_TABLET | ORAL | Status: DC | PRN

## 2017-07-15 MED ORDER — BENZOCAINE-MENTHOL 20-0.5 % EX AERO
1.0000 "application " | INHALATION_SPRAY | CUTANEOUS | Status: DC | PRN
  Filled 2017-07-15: qty 56

## 2017-07-15 MED ORDER — TERBUTALINE SULFATE 1 MG/ML IJ SOLN: 0.2500 mg | Freq: Once | INTRAMUSCULAR | Status: DC | PRN

## 2017-07-15 MED ORDER — PHENYLEPHRINE 40 MCG/ML (10ML) SYRINGE FOR IV PUSH (FOR BLOOD PRESSURE SUPPORT): 80.0000 ug | PREFILLED_SYRINGE | INTRAVENOUS | Status: DC | PRN

## 2017-07-15 MED ORDER — ONDANSETRON HCL 4 MG/2ML IJ SOLN: 4.0000 mg | INTRAMUSCULAR | Status: DC | PRN

## 2017-07-15 MED ORDER — PRENATAL MULTIVITAMIN CH
1.0000 | ORAL_TABLET | Freq: Every day | ORAL | Status: DC
  Administered 2017-07-16: 1 via ORAL
  Filled 2017-07-15: qty 1

## 2017-07-15 MED ORDER — MISOPROSTOL 25 MCG QUARTER TABLET
25.0000 ug | ORAL_TABLET | ORAL | Status: DC | PRN
  Administered 2017-07-15 (×2): 25 ug via VAGINAL
  Filled 2017-07-15 (×2): qty 1

## 2017-07-15 MED ORDER — OXYCODONE-ACETAMINOPHEN 5-325 MG PO TABS: 2.0000 | ORAL_TABLET | ORAL | Status: DC | PRN

## 2017-07-15 MED ORDER — IBUPROFEN 600 MG PO TABS
600.0000 mg | ORAL_TABLET | Freq: Four times a day (QID) | ORAL | Status: DC
  Administered 2017-07-15 – 2017-07-16 (×5): 600 mg via ORAL
  Filled 2017-07-15 (×5): qty 1

## 2017-07-15 MED ORDER — ZOLPIDEM TARTRATE 5 MG PO TABS: 5.0000 mg | ORAL_TABLET | Freq: Every evening | ORAL | Status: DC | PRN

## 2017-07-15 MED ORDER — OXYTOCIN 40 UNITS IN LACTATED RINGERS INFUSION - SIMPLE MED
1.0000 m[IU]/min | INTRAVENOUS | Status: DC
  Administered 2017-07-15: 2 m[IU]/min via INTRAVENOUS
  Filled 2017-07-15: qty 1000

## 2017-07-15 MED ORDER — ONDANSETRON HCL 4 MG PO TABS: 4.0000 mg | ORAL_TABLET | ORAL | Status: DC | PRN

## 2017-07-15 MED ORDER — FLEET ENEMA 7-19 GM/118ML RE ENEM: 1.0000 | ENEMA | RECTAL | Status: DC | PRN

## 2017-07-15 MED ORDER — SIMETHICONE 80 MG PO CHEW: 80.0000 mg | CHEWABLE_TABLET | ORAL | Status: DC | PRN

## 2017-07-15 MED ORDER — PHENYLEPHRINE 40 MCG/ML (10ML) SYRINGE FOR IV PUSH (FOR BLOOD PRESSURE SUPPORT)
PREFILLED_SYRINGE | INTRAVENOUS | Status: AC
  Filled 2017-07-15: qty 20

## 2017-07-15 MED ORDER — LIDOCAINE HCL (PF) 1 % IJ SOLN
INTRAMUSCULAR | Status: DC | PRN
  Administered 2017-07-15: 8 mL via EPIDURAL

## 2017-07-15 MED ORDER — WITCH HAZEL-GLYCERIN EX PADS: 1.0000 "application " | MEDICATED_PAD | CUTANEOUS | Status: DC | PRN

## 2017-07-15 MED ORDER — FENTANYL 2.5 MCG/ML BUPIVACAINE 1/10 % EPIDURAL INFUSION (WH - ANES)
INTRAMUSCULAR | Status: AC
  Filled 2017-07-15: qty 100

## 2017-07-15 MED ORDER — LIDOCAINE HCL (PF) 1 % IJ SOLN
30.0000 mL | INTRAMUSCULAR | Status: DC | PRN
  Filled 2017-07-15: qty 30

## 2017-07-15 MED ORDER — DIPHENHYDRAMINE HCL 25 MG PO CAPS: 25.0000 mg | ORAL_CAPSULE | Freq: Four times a day (QID) | ORAL | Status: DC | PRN

## 2017-07-15 MED ORDER — OXYTOCIN BOLUS FROM INFUSION
500.0000 mL | Freq: Once | INTRAVENOUS | Status: AC
  Administered 2017-07-15: 500 mL via INTRAVENOUS

## 2017-07-15 MED ORDER — LACTATED RINGERS IV SOLN
500.0000 mL | INTRAVENOUS | Status: DC | PRN
  Administered 2017-07-15: 500 mL via INTRAVENOUS

## 2017-07-15 MED ORDER — OXYTOCIN 40 UNITS IN LACTATED RINGERS INFUSION - SIMPLE MED
2.5000 [IU]/h | INTRAVENOUS | Status: DC
  Administered 2017-07-15: 2.5 [IU]/h via INTRAVENOUS

## 2017-07-15 NOTE — H&P (Signed)
Kris Hartmannrin E Durante is a 37 y.o. female presenting for elective IOL.  Antepartum course complicated by subchorionic hemorrhage (7 cm) at anatomy scan; serial u/s showed resolution.  GBS negative.  Patient received second dose of VMP at 0534.  Feeling mild cramping.  Active FM.  OB History    Gravida  2   Para  1   Term  1   Preterm  0   AB  0   Living  1     SAB  0   TAB  0   Ectopic  0   Multiple  0   Live Births  1          Past Medical History:  Diagnosis Date  . Depression    mild ppd  . Migraine   . Smoker    1-2 CIG PER DAY   Past Surgical History:  Procedure Laterality Date  . APPENDECTOMY  05/2008  . DILATION AND EVACUATION N/A 09/27/2015   Procedure: DILATATION AND EVACUATION;  Surgeon: Mitchel HonourMegan Marqueta Pulley, DO;  Location: [facility];  Service: Gynecology;  Laterality: N/A;  . WISDOM TOOTH EXTRACTION     Family History: family history includes Alzheimer's disease in her paternal grandfather; Cancer in her maternal grandmother; Depression in her paternal grandmother; Diabetes in her maternal grandfather and maternal grandmother; Diverticulitis in her paternal grandmother; Hypertension in her maternal grandfather; Parkinson's disease in her paternal grandfather. Social History:  reports that she quit smoking about 5 years ago. She has never used smokeless tobacco. She reports that she does not drink alcohol or use drugs.     Maternal Diabetes: No Genetic Screening: Normal Maternal Ultrasounds/Referrals: Normal Fetal Ultrasounds or other Referrals:  None Maternal Substance Abuse:  No Significant Maternal Medications:  None Significant Maternal Lab Results:  Lab values include: Group B Strep negative Other Comments:  None  ROS Maternal Medical History:  Fetal activity: Perceived fetal activity is normal.   Last perceived fetal movement was within the past hour.    Prenatal complications: no prenatal complications Prenatal Complications - Diabetes:  none.    Dilation: 1.5 Effacement (%): 50 Station: -3 Exam by:: Sade Harper,RN Blood pressure 112/65, pulse 75, temperature 98.1 F (36.7 C), temperature source Oral, resp. rate 19, height 5\' 2"  (1.575 m), weight 230 lb 3.2 oz (104.4 kg), last menstrual period 10/10/2016, unknown if currently breastfeeding. Maternal Exam:  Uterine Assessment: Contraction strength is mild.  Contraction frequency is rare.   Abdomen: Patient reports no abdominal tenderness. Fundal height is c/w dates.   Estimated fetal weight is 8#.       Physical Exam  Constitutional: She is oriented to person, place, and time. She appears well-developed and well-nourished.  GI: Soft. There is no rebound and no guarding.  Neurological: She is alert and oriented to person, place, and time.  Skin: Skin is warm and dry.  Psychiatric: She has a normal mood and affect. Her behavior is normal.    Prenatal labs: ABO, Rh: --/--/A POS (06/05 0124) Antibody: NEG (06/05 0124) Rubella: Equivocal (11/06 0000) RPR: Nonreactive (11/06 0000)  HBsAg: Negative (11/06 0000)  HIV: Non-reactive (11/06 0000)  GBS:     Assessment/Plan: 36yo G3P1011 at 39w for elective IOL -Recheck at 0930 and AROM if able vs pitocin -Epidural when desired -Anticipate NSVD   Damara Klunder 07/15/2017, 7:30 AM

## 2017-07-15 NOTE — Anesthesia Pain Management Evaluation Note (Signed)
  [facility] Pain Management Visit Note  Patient: Kerry [name], 37 y.o., female  "Hello I am a member of the anesthesia team at [facility]. We have an anesthesia team available at all times to provide care throughout the hospital, including epidural management and anesthesia for C-section. I don't know your plan for the delivery whether it a natural birth, water birth, IV sedation, nitrous supplementation, doula or epidural, but we want to meet your pain goals."   1.Was your pain managed to your expectations on prior hospitalizations?   Yes   2.What is your expectation for pain management during this hospitalization?     Epidural  3.How can we help you reach that goal?   Record the patient's initial score and the patient's pain goal.   Pain: 2  Pain Goal: 5 [facility] wants you to be able to say your pain was always managed very well.  Laban EmperorMalinova,Terelle Dobler Hristova 07/15/2017

## 2017-07-15 NOTE — Anesthesia Postprocedure Evaluation (Signed)
Anesthesia Post Note  Patient: Kerry [name]  Procedure(s) Performed: AN AD HOC LABOR EPIDURAL     Patient location during evaluation: Mother Baby Anesthesia Type: Epidural Level of consciousness: awake and alert Pain management: pain level controlled Vital Signs Assessment: post-procedure vital signs reviewed and stable Respiratory status: spontaneous breathing, nonlabored ventilation and respiratory function stable Cardiovascular status: stable Postop Assessment: no headache, no backache and epidural receding Anesthetic complications: no    Last Vitals:  Vitals:   07/15/17 1701 07/15/17 1716  BP: 123/81 101/64  Pulse:  (!) 103  Resp:    Temp:    SpO2:      Last Pain:  Vitals:   07/15/17 1352  TempSrc: Oral  PainSc:    Pain Goal: Patients Stated Pain Goal: 5 (07/15/17 1201)               Andon Villard

## 2017-07-15 NOTE — Anesthesia Preprocedure Evaluation (Signed)
Anesthesia Evaluation  Patient identified by MRN, date of birth, ID band Patient awake    Reviewed: Allergy & Precautions, H&P , NPO status , Patient's Chart, lab work & pertinent test results, reviewed documented beta blocker date and time   Airway Mallampati: I  TM Distance: >3 FB Neck ROM: full    Dental no notable dental hx.    Pulmonary neg pulmonary ROS, former smoker,    Pulmonary exam normal breath sounds clear to auscultation       Cardiovascular negative cardio ROS Normal cardiovascular exam Rhythm:regular Rate:Normal     Neuro/Psych negative neurological ROS  negative psych ROS   GI/Hepatic negative GI ROS, Neg liver ROS,   Endo/Other  negative endocrine ROS  Renal/GU negative Renal ROS  negative genitourinary   Musculoskeletal   Abdominal   Peds  Hematology negative hematology ROS (+)   Anesthesia Other Findings   Reproductive/Obstetrics (+) Pregnancy                             Anesthesia Physical Anesthesia Plan  ASA: II  Anesthesia Plan: Epidural   Post-op Pain Management:    Induction:   PONV Risk Score and Plan:   Airway Management Planned:   Additional Equipment:   Intra-op Plan:   Post-operative Plan:   Informed Consent: I have reviewed the patients History and Physical, chart, labs and discussed the procedure including the risks, benefits and alternatives for the proposed anesthesia with the patient or authorized representative who has indicated his/her understanding and acceptance.       Plan Discussed with:   Anesthesia Plan Comments:         Anesthesia Quick Evaluation  

## 2017-07-15 NOTE — Anesthesia Procedure Notes (Signed)
Epidural Patient location during procedure: OB Start time: 07/15/2017 9:10 AM End time: 07/15/2017 10:15 AM  Staffing Anesthesiologist: Bethena Midgetddono, Darby Fleeman, MD  Preanesthetic Checklist Completed: patient identified, site marked, surgical consent, pre-op evaluation, timeout performed, IV checked, risks and benefits discussed and monitors and equipment checked  Epidural Patient position: sitting Prep: site prepped and draped and DuraPrep Patient monitoring: continuous pulse ox and blood pressure Approach: midline Location: L4-L5 Injection technique: LOR air  Needle:  Needle type: Tuohy  Needle gauge: 17 G Needle length: 9 cm and 9 Needle insertion depth: 7 cm Catheter type: closed end flexible Catheter size: 19 Gauge Catheter at skin depth: 12 cm Test dose: negative  Assessment Events: blood not aspirated, injection not painful, no injection resistance, negative IV test and no paresthesia

## 2017-07-15 NOTE — Progress Notes (Signed)
CVX 3/50/-2, AROM, clear Start pitocin 2/2.  Kerry HonourMegan Deovion Batrez, DO

## 2017-07-16 ENCOUNTER — Other Ambulatory Visit: Payer: Self-pay

## 2017-07-16 LAB — CBC
HEMATOCRIT: 25 % — AB (ref 36.0–46.0)
HEMOGLOBIN: 8.6 g/dL — AB (ref 12.0–15.0)
MCH: 30.7 pg (ref 26.0–34.0)
MCHC: 34.4 g/dL (ref 30.0–36.0)
MCV: 89.3 fL (ref 78.0–100.0)
Platelets: 164 10*3/uL (ref 150–400)
RBC: 2.8 MIL/uL — ABNORMAL LOW (ref 3.87–5.11)
RDW: 14.3 % (ref 11.5–15.5)
WBC: 12.6 10*3/uL — AB (ref 4.0–10.5)

## 2017-07-16 MED ORDER — MEASLES, MUMPS & RUBELLA VAC ~~LOC~~ INJ
0.5000 mL | INJECTION | Freq: Once | SUBCUTANEOUS | Status: DC
  Filled 2017-07-16: qty 0.5

## 2017-07-16 MED ORDER — IBUPROFEN 600 MG PO TABS: 600.0000 mg | ORAL_TABLET | Freq: Four times a day (QID) | ORAL | 0 refills | Status: DC

## 2017-07-16 NOTE — Progress Notes (Signed)
CSW received consult for hx of Postpartum Depression.  CSW met with [name] to offer support and complete assessment.    When CSW arrived, [name] was resting in bed and FOB was bonding with infant as evidence by engaging in skin to skin. CSW explained, CSW's role and [name] gave CSW permission to complete the assessment while FOB was present.  The couple was supportive of one another and easy to engage.  [name]'s affect and mood were WNL.   CSW asked about [name]'s PPD hx and [name] openly shared her experience.  [name] attributed her symptoms to [name]'s traumatic birthing experience and post surgical procedure. [name] communicated feeling anxious and overwhelmed and symptoms resolved after 6 weeks. [name] reported having a great support team and feeling overly excited about being a new mom again.   CSW provided education regarding the baby blues period vs. perinatal mood disorders, discussed treatment and gave resources for mental health follow up if concerns arise.  CSW recommends self-evaluation during the postpartum time period using the New Mom Checklist from Postpartum Progress and encouraged [name] to contact a medical professional if symptoms are noted at any time.   CSW presented with insight and awareness and did not demonstrate any acute symptoms during assessment.  CSW assessed for safety and [name] denied SI and HI.    CSW identifies no further need for intervention and no barriers to discharge at this time.  Zelta Enfield Boyd-Gilyard, MSW, LCSW Clinical Social Work [phone]  

## 2017-07-16 NOTE — Lactation Note (Signed)
This note was copied from a baby's chart. Lactation Consultation Note Baby 13 hrs old. Mom had good sleeping periods during the night. Mom BF her 1st child now 475 yrs old.  Mom has tubular breast w/flat nipples at the bottom end of the breast. Very compressible. W/stimulation will evert to very short shaft. No NS shield needed at this time. Taught "C" hold and t-cup hold.  Took several times to latch the baby d/t not opening mouth wide enough. Mom kept pulling back on breast tissue causing shallow latch. Kept reminding mom not to. Mom felt the baby couldn't breathe. Demonstrated tilting bady and head back w/chin into breast and nose tilts back. Demonstrated using support.  Hand expressed easy flow of colostrum. Collected 2 ml. Gave spoon and discussed spoon feeding.  Reviewed newborn behavior, STS, I&O, milk storage, and feeding habits. Answered questions. Mom encouraged to feed baby 8-12 times/24 hours and with feeding cues.  [facility] brochure given w/resources, support groups and LC services.  Patient Name: Kerry [name] Reason for consult: Initial assessment   Maternal Data Has patient been taught Hand Expression?: Yes Does the patient have breastfeeding experience prior to this delivery?: Yes  Feeding Feeding Type: Breast Fed Length of feed: 10 min(still BF)  LATCH Score Latch: Repeated attempts needed to sustain latch, nipple held in mouth throughout feeding, stimulation needed to elicit sucking reflex.  Audible Swallowing: Spontaneous and intermittent  Type of Nipple: Flat  Comfort (Breast/Nipple): Soft / non-tender  Hold (Positioning): Assistance needed to correctly position infant at breast and maintain latch.  LATCH Score: 7  Interventions Interventions: Breast feeding basics reviewed;Support pillows;Assisted with latch;Position options;Skin to skin;Expressed milk;Breast massage;Hand express;Breast compression;Adjust position  Lactation Tools  Discussed/Used WIC Program: No   Consult Status Consult Status: Follow-up Date: 07/17/17 Follow-up type: In-patient    [name], Diamond NickelLAURA G [name], 6:07 AM

## 2017-07-16 NOTE — Discharge Summary (Signed)
Obstetric Discharge Summary Reason for Admission: induction of labor Prenatal Procedures: ultrasound Intrapartum Procedures: spontaneous vaginal delivery Postpartum Procedures: none Complications-Operative and Postpartum: none Hemoglobin  Date Value Ref Range Status  07/16/2017 8.6 (L) 12.0 - 15.0 g/dL Final   HCT  Date Value Ref Range Status  07/16/2017 25.0 (L) 36.0 - 46.0 % Final    Physical Exam:  General: alert and cooperative Lochia: appropriate Uterine Fundus: firm Incision: healing well, no significant drainage DVT Evaluation: No evidence of DVT seen on physical exam.  Discharge Diagnoses: Term Pregnancy-delivered  Discharge Information: Date: 07/16/2017 Activity: pelvic rest Diet: routine Medications: PNV and Ibuprofen Condition: stable Instructions: refer to practice specific booklet Discharge to: home Follow-up Information    [place] Schedule an appointment as soon as possible for a visit in 6 week(s).   Contact information: [address] [place] [id number] [phone]           Newborn Data: Live born female  Birth Weight: 7 lb 7.8 oz (3396 g) APGAR: 9, 9  Newborn Delivery   Birth date/time:  07/15/2017 16:34:00 Delivery type:  Vaginal, Spontaneous     Home with mother.  Kerry [name] 07/16/2017, 8:47 AM

## 2017-07-17 ENCOUNTER — Inpatient Hospital Stay (HOSPITAL_COMMUNITY)
Admission: AD | Admit: 2017-07-17 | Payer: BLUE CROSS/BLUE SHIELD | Source: Ambulatory Visit | Admitting: Obstetrics & Gynecology

## 2019-05-19 ENCOUNTER — Ambulatory Visit: Payer: Self-pay | Attending: Family

## 2019-05-19 DIAGNOSIS — Z23 Encounter for immunization: Secondary | ICD-10-CM

## 2019-05-19 NOTE — Progress Notes (Signed)
   Covid-19 Vaccination Clinic  Name:  Kerry [name]    MRN: [id number] DOB: 1980-09-22  05/19/2019  [name] was observed post Covid-19 immunization for 15 minutes without incident. She was provided with Vaccine Information Sheet and instruction to access the V-Safe system.   [name] was instructed to call 911 with any severe reactions post vaccine: Marland Kitchen Difficulty breathing  . Swelling of face and throat  . A fast heartbeat  . A bad rash all over body  . Dizziness and weakness   Immunizations Administered    Name Date Dose VIS Date Route   Moderna COVID-19 Vaccine 05/19/2019 12:11 PM 0.5 mL 01/11/2019 Intramuscular   Manufacturer: Moderna   Lot: [id number]   NDC: 22449-753-00

## 2019-06-14 ENCOUNTER — Ambulatory Visit: Payer: Self-pay | Attending: Family

## 2019-06-14 DIAGNOSIS — Z23 Encounter for immunization: Secondary | ICD-10-CM

## 2019-06-14 NOTE — Progress Notes (Signed)
   Covid-19 Vaccination Clinic  Name:  Kerry [name]    MRN: [id number] DOB: [birth year]  06/14/2019  [name] was observed post Covid-19 immunization for 15 minutes without incident. She was provided with Vaccine Information Sheet and instruction to access the V-Safe system.   [name] was instructed to call 911 with any severe reactions post vaccine: Marland Kitchen Difficulty breathing  . Swelling of face and throat  . A fast heartbeat  . A bad rash all over body  . Dizziness and weakness   Immunizations Administered    Name Date Dose VIS Date Route   Moderna COVID-19 Vaccine 06/14/2019  1:29 PM 0.5 mL 01/2019 Intramuscular   Manufacturer: Moderna   Lot: [id number]   NDC: 55374-827-07

## 2019-06-21 ENCOUNTER — Ambulatory Visit: Payer: Self-pay

## 2021-07-11 DIAGNOSIS — Z13220 Encounter for screening for lipoid disorders: Secondary | ICD-10-CM | POA: Diagnosis not present

## 2021-07-11 DIAGNOSIS — Z6841 Body Mass Index (BMI) 40.0 and over, adult: Secondary | ICD-10-CM | POA: Diagnosis not present

## 2021-07-11 DIAGNOSIS — Z01419 Encounter for gynecological examination (general) (routine) without abnormal findings: Secondary | ICD-10-CM | POA: Diagnosis not present

## 2021-07-11 DIAGNOSIS — Z131 Encounter for screening for diabetes mellitus: Secondary | ICD-10-CM | POA: Diagnosis not present

## 2021-07-11 DIAGNOSIS — Z1329 Encounter for screening for other suspected endocrine disorder: Secondary | ICD-10-CM | POA: Diagnosis not present

## 2021-07-11 DIAGNOSIS — Z13228 Encounter for screening for other metabolic disorders: Secondary | ICD-10-CM | POA: Diagnosis not present

## 2021-10-31 DIAGNOSIS — J0110 Acute frontal sinusitis, unspecified: Secondary | ICD-10-CM | POA: Diagnosis not present

## 2021-11-28 DIAGNOSIS — J0140 Acute pansinusitis, unspecified: Secondary | ICD-10-CM | POA: Diagnosis not present

## 2022-02-25 ENCOUNTER — Other Ambulatory Visit: Payer: Self-pay | Admitting: Physician Assistant

## 2022-02-25 DIAGNOSIS — Z1231 Encounter for screening mammogram for malignant neoplasm of breast: Secondary | ICD-10-CM

## 2022-03-11 ENCOUNTER — Ambulatory Visit: Payer: BC Managed Care – PPO

## 2022-03-11 ENCOUNTER — Ambulatory Visit
Admission: RE | Admit: 2022-03-11 | Discharge: 2022-03-11 | Disposition: A | Discharge status: Home / Self Care | Payer: BC Managed Care – PPO | Source: Ambulatory Visit | Attending: Physician Assistant | Admitting: Physician Assistant

## 2022-03-11 DIAGNOSIS — Z1231 Encounter for screening mammogram for malignant neoplasm of breast: Secondary | ICD-10-CM | POA: Diagnosis not present

## 2022-03-14 ENCOUNTER — Other Ambulatory Visit: Payer: Self-pay | Admitting: Physician Assistant

## 2022-03-14 DIAGNOSIS — R928 Other abnormal and inconclusive findings on diagnostic imaging of breast: Secondary | ICD-10-CM

## 2022-03-21 ENCOUNTER — Other Ambulatory Visit: Payer: Self-pay | Admitting: Physician Assistant

## 2022-03-21 ENCOUNTER — Ambulatory Visit
Admission: RE | Admit: 2022-03-21 | Discharge: 2022-03-21 | Disposition: A | Discharge status: Home / Self Care | Payer: BC Managed Care – PPO | Source: Ambulatory Visit | Attending: Physician Assistant | Admitting: Physician Assistant

## 2022-03-21 DIAGNOSIS — N6320 Unspecified lump in the left breast, unspecified quadrant: Secondary | ICD-10-CM

## 2022-03-21 DIAGNOSIS — R928 Other abnormal and inconclusive findings on diagnostic imaging of breast: Secondary | ICD-10-CM

## 2022-03-21 DIAGNOSIS — N6012 Diffuse cystic mastopathy of left breast: Secondary | ICD-10-CM | POA: Diagnosis not present

## 2022-04-10 DIAGNOSIS — M1712 Unilateral primary osteoarthritis, left knee: Secondary | ICD-10-CM | POA: Diagnosis not present

## 2022-06-17 DIAGNOSIS — R6882 Decreased libido: Secondary | ICD-10-CM | POA: Diagnosis not present

## 2022-06-17 DIAGNOSIS — Z13220 Encounter for screening for lipoid disorders: Secondary | ICD-10-CM | POA: Diagnosis not present

## 2022-06-17 DIAGNOSIS — F320 Major depressive disorder, single episode, mild: Secondary | ICD-10-CM | POA: Diagnosis not present

## 2022-06-17 DIAGNOSIS — Z0000 Encounter for general adult medical examination without abnormal findings: Secondary | ICD-10-CM | POA: Diagnosis not present

## 2022-06-24 DIAGNOSIS — R6882 Decreased libido: Secondary | ICD-10-CM | POA: Diagnosis not present

## 2022-07-11 DIAGNOSIS — E288 Other ovarian dysfunction: Secondary | ICD-10-CM | POA: Diagnosis not present

## 2022-07-11 DIAGNOSIS — R635 Abnormal weight gain: Secondary | ICD-10-CM | POA: Diagnosis not present

## 2022-07-11 DIAGNOSIS — R7989 Other specified abnormal findings of blood chemistry: Secondary | ICD-10-CM | POA: Diagnosis not present

## 2022-09-25 ENCOUNTER — Ambulatory Visit
Admission: RE | Admit: 2022-09-25 | Discharge: 2022-09-25 | Disposition: A | Discharge status: Home / Self Care | Payer: BC Managed Care – PPO | Source: Ambulatory Visit | Attending: Physician Assistant | Admitting: Physician Assistant

## 2022-09-25 ENCOUNTER — Other Ambulatory Visit: Payer: Self-pay | Admitting: Physician Assistant

## 2022-09-25 DIAGNOSIS — N6012 Diffuse cystic mastopathy of left breast: Secondary | ICD-10-CM | POA: Diagnosis not present

## 2022-09-25 DIAGNOSIS — N6320 Unspecified lump in the left breast, unspecified quadrant: Secondary | ICD-10-CM

## 2022-10-14 DIAGNOSIS — E288 Other ovarian dysfunction: Secondary | ICD-10-CM | POA: Diagnosis not present

## 2022-10-14 DIAGNOSIS — R7989 Other specified abnormal findings of blood chemistry: Secondary | ICD-10-CM | POA: Diagnosis not present

## 2022-10-20 DIAGNOSIS — R635 Abnormal weight gain: Secondary | ICD-10-CM | POA: Diagnosis not present

## 2022-10-20 DIAGNOSIS — E288 Other ovarian dysfunction: Secondary | ICD-10-CM | POA: Diagnosis not present

## 2022-10-20 DIAGNOSIS — R7989 Other specified abnormal findings of blood chemistry: Secondary | ICD-10-CM | POA: Diagnosis not present

## 2022-10-30 DIAGNOSIS — R053 Chronic cough: Secondary | ICD-10-CM | POA: Diagnosis not present

## 2022-11-03 DIAGNOSIS — R053 Chronic cough: Secondary | ICD-10-CM | POA: Diagnosis not present

## 2022-12-31 DIAGNOSIS — Z124 Encounter for screening for malignant neoplasm of cervix: Secondary | ICD-10-CM | POA: Diagnosis not present

## 2022-12-31 DIAGNOSIS — Z01419 Encounter for gynecological examination (general) (routine) without abnormal findings: Secondary | ICD-10-CM | POA: Diagnosis not present

## 2022-12-31 DIAGNOSIS — N926 Irregular menstruation, unspecified: Secondary | ICD-10-CM | POA: Diagnosis not present

## 2022-12-31 DIAGNOSIS — Z1151 Encounter for screening for human papillomavirus (HPV): Secondary | ICD-10-CM | POA: Diagnosis not present

## 2022-12-31 DIAGNOSIS — Z6841 Body Mass Index (BMI) 40.0 and over, adult: Secondary | ICD-10-CM | POA: Diagnosis not present

## 2023-02-12 DIAGNOSIS — E88819 Insulin resistance, unspecified: Secondary | ICD-10-CM | POA: Diagnosis not present

## 2023-02-19 DIAGNOSIS — R635 Abnormal weight gain: Secondary | ICD-10-CM | POA: Diagnosis not present

## 2023-02-19 DIAGNOSIS — E288 Other ovarian dysfunction: Secondary | ICD-10-CM | POA: Diagnosis not present

## 2023-02-19 DIAGNOSIS — R7989 Other specified abnormal findings of blood chemistry: Secondary | ICD-10-CM | POA: Diagnosis not present

## 2023-03-10 DIAGNOSIS — E88819 Insulin resistance, unspecified: Secondary | ICD-10-CM | POA: Diagnosis not present

## 2023-03-27 ENCOUNTER — Encounter: Payer: Self-pay | Admitting: Obstetrics

## 2023-03-27 ENCOUNTER — Ambulatory Visit: Payer: BC Managed Care – PPO | Admitting: Obstetrics

## 2023-03-27 VITALS — BP 123/82 | HR 86 | Ht 63.0 in | Wt 220.6 lb

## 2023-03-27 DIAGNOSIS — R3914 Feeling of incomplete bladder emptying: Secondary | ICD-10-CM | POA: Insufficient documentation

## 2023-03-27 DIAGNOSIS — R351 Nocturia: Secondary | ICD-10-CM | POA: Insufficient documentation

## 2023-03-27 DIAGNOSIS — N941 Unspecified dyspareunia: Secondary | ICD-10-CM | POA: Insufficient documentation

## 2023-03-27 DIAGNOSIS — N3946 Mixed incontinence: Secondary | ICD-10-CM | POA: Insufficient documentation

## 2023-03-27 DIAGNOSIS — N816 Rectocele: Secondary | ICD-10-CM | POA: Insufficient documentation

## 2023-03-27 DIAGNOSIS — Z6839 Body mass index (BMI) 39.0-39.9, adult: Secondary | ICD-10-CM | POA: Insufficient documentation

## 2023-03-27 DIAGNOSIS — K5900 Constipation, unspecified: Secondary | ICD-10-CM | POA: Diagnosis not present

## 2023-03-27 DIAGNOSIS — Z9889 Other specified postprocedural states: Secondary | ICD-10-CM | POA: Insufficient documentation

## 2023-03-27 DIAGNOSIS — N926 Irregular menstruation, unspecified: Secondary | ICD-10-CM | POA: Insufficient documentation

## 2023-03-27 LAB — POCT URINALYSIS DIPSTICK
Bilirubin, UA: NEGATIVE
Bilirubin, UA: NEGATIVE
Blood, UA: NEGATIVE
Glucose, UA: NEGATIVE
Glucose, UA: NEGATIVE
Ketones, UA: NEGATIVE
Ketones, UA: NEGATIVE
Leukocytes, UA: NEGATIVE
Nitrite, UA: NEGATIVE
Nitrite, UA: NEGATIVE
Protein, UA: NEGATIVE
Protein, UA: NEGATIVE
Spec Grav, UA: 1.01 (ref 1.010–1.025)
Spec Grav, UA: 1.015 (ref 1.010–1.025)
Urobilinogen, UA: 0.2 U/dL
Urobilinogen, UA: 0.2 U/dL
pH, UA: 7.5 (ref 5.0–8.0)
pH, UA: 7.5 (ref 5.0–8.0)

## 2023-03-27 MED ORDER — GEMTESA 75 MG PO TABS: 75.0000 mg | ORAL_TABLET | Freq: Every day | ORAL | 2 refills | Status: DC

## 2023-03-27 MED ORDER — GEMTESA 75 MG PO TABS: 75.0000 mg | ORAL_TABLET | Freq: Every day | ORAL | Status: DC

## 2023-03-27 NOTE — Patient Instructions (Addendum)
Mixed Incontinence (MUI):  MUI includes symptoms of both Overactive bladder (OAB) and stress incontinence (SUI).    Overactive bladder (OAB) causes bladder urgency, frequency, and having to void at night with or without leakage.  Several  treatment options exist, including behavioral changes (avoiding caffeine, etc), physical therapy, medications, and neuromodulation (ways to change the nerve signals to the bladder).   Stress incontinence (SUI) causes urinary leakage with coughing, laughing, sneezing and occasionally during exercise or bending/lifting.  Treatment options include nonsurgical options such as Kegel (pelvic floor) exercises, physical therapy, pessary (vaginal device similar to a diaphragm to prevent leakage) or surgical options such as a midurethral sling.   You have a stage 2 (out of 4) prolapse.  We discussed the fact that it is not life threatening but there are several treatment options. For treatment of pelvic organ prolapse, we discussed options for management including expectant management, conservative management, and surgical management, such as Kegels, a pessary, pelvic floor physical therapy, and specific surgical procedures.     Constipation: Our goal is to achieve formed bowel movements daily or every-other-day.  You may need to try different combinations of the following options to find what works best for you - everybody's body works differently so feel free to adjust the dosages as needed.  Some options to help maintain bowel health include:  Dietary changes (more leafy greens, vegetables and fruits; less processed foods) Fiber supplementation (Benefiber, FiberCon, Metamucil or Psyllium). Start slow and increase gradually to full dose. Over-the-counter agents such as: stool softeners (Docusate or Colace) and/or laxatives (Miralax, milk of magnesia)  "Power Pudding" is a natural mixture that may help your constipation.  To make blend 1 cup applesauce, 1 cup wheat bran, and  3/4 cup prune juice, refrigerate and then take 1 tablespoon daily with a large glass of water as needed.   Women should try to eat at least 21 to 25 grams of fiber a day, while men should aim for 30 to 38 grams a day. You can add fiber to your diet with food or a fiber supplement such as psyllium (metamucil), benefiber, or fibercon.   Here's a look at how much dietary fiber is found in some common foods. When buying packaged foods, check the Nutrition Facts label for fiber content. It can vary among brands.  Fruits Serving size Total fiber (grams)*  Raspberries 1 cup 8.0  Pear 1 medium 5.5  Apple, with skin 1 medium 4.5  Banana 1 medium 3.0  Orange 1 medium 3.0  Strawberries 1 cup 3.0   Vegetables Serving size Total fiber (grams)*  Green peas, boiled 1 cup 9.0  Broccoli, boiled 1 cup chopped 5.0  Turnip greens, boiled 1 cup 5.0  Brussels sprouts, boiled 1 cup 4.0  Potato, with skin, baked 1 medium 4.0  Sweet corn, boiled 1 cup 3.5  Cauliflower, raw 1 cup chopped 2.0  Carrot, raw 1 medium 1.5   Grains Serving size Total fiber (grams)*  Spaghetti, whole-wheat, cooked 1 cup 6.0  Barley, pearled, cooked 1 cup 6.0  Bran flakes 3/4 cup 5.5  Quinoa, cooked 1 cup 5.0  Oat bran muffin 1 medium 5.0  Oatmeal, instant, cooked 1 cup 5.0  Popcorn, air-popped 3 cups 3.5  Brown rice, cooked 1 cup 3.5  Bread, whole-wheat 1 slice 2.0  Bread, rye 1 slice 2.0   Legumes, nuts and seeds Serving size Total fiber (grams)*  Split peas, boiled 1 cup 16.0  Lentils, boiled 1 cup 15.5  Black beans,  boiled 1 cup 15.0  Baked beans, canned 1 cup 10.0  Chia seeds 1 ounce 10.0  Almonds 1 ounce (23 nuts) 3.5  Pistachios 1 ounce (49 nuts) 3.0  Sunflower kernels 1 ounce 3.0  *Rounded to nearest 0.5 gram. Source: [employer]	[place]    Continue working on your weight loss, good work!  Please follow-up with Dr. Langston Masker for endometrial biopsy due to your  recent change in menstrual cycles.

## 2023-03-27 NOTE — Assessment & Plan Note (Signed)
-   using Zepbound - active lifestyle with coaching - encouraged to continue active lifestyle and diet modification - net 11lb weight loss - discussed association of BMI with pelvic floor problems

## 2023-03-27 NOTE — Assessment & Plan Note (Signed)
-   most bothersome symptoms from posterior vaginal wall prolapse, splints 2x/week with constipation - prior pessary trial with discomfort - For treatment of pelvic organ prolapse, we discussed options for management including expectant management, conservative management, and surgical management, such as Kegels, a pessary, pelvic floor physical therapy, and specific surgical procedures. - discussed fiber supplementation - encouraged to consider pelvic floor PT - reviewed surgical intervention options with anterior/posterior colporrhaphy. Discussed increased risk of dyspareunia - discussed need for repeat exam to r/o apical prolapse

## 2023-03-27 NOTE — Assessment & Plan Note (Signed)
-   avoid fluid intake after 6pm

## 2023-03-27 NOTE — Assessment & Plan Note (Signed)
-   POCT clean catch UA + heme, resolved on catheterized sample - PVR 95mL - stress > urgency - For treatment of stress urinary incontinence,  non-surgical options include expectant management, weight loss, physical therapy, as well as a pessary.  Surgical options include a midurethral sling, Burch urethropexy, and transurethral injection of a bulking agent. - failed pessary due to discomfort - we discussed an office procedure with urethral bulking (Bulkamid). We discussed success rate of approximately 70-80% and possible need for second injection. We reviewed that this is not a permanent procedure and the Bulkamid does dissolve over time. Risks reviewed including injury to bladder or urethra, UTI, urinary retention and hematuria.  - We discussed the symptoms of overactive bladder (OAB), which include urinary urgency, urinary frequency, nocturia, with or without urge incontinence.  While we do not know the exact etiology of OAB, several treatment options exist. We discussed management including behavioral therapy (decreasing bladder irritants, urge suppression strategies, timed voids, bladder retraining), physical therapy, medication; for refractory cases posterior tibial nerve stimulation, sacral neuromodulation, and intravesical botulinum toxin injection.  For anticholinergic medications, we discussed the potential side effects of anticholinergics including dry eyes, dry mouth, constipation, cognitive impairment and urinary retention. For Beta-3 agonist medication, we discussed the potential side effect of elevated blood pressure which is more likely to occur in individuals with uncontrolled hypertension. - samples and Rx Gemtesa to assess clinical change - discussed urodynamics prior to surgical intervention, pt desires to postpone until repeat vaginal exam and additional weight loss

## 2023-03-27 NOTE — Progress Notes (Signed)
New Patient Evaluation and Consultation  Referring Provider: Mitchel Honour, DO PCP: Exie Parody Date of Service: 03/27/2023  SUBJECTIVE Chief Complaint: New Patient (Initial Visit) (CHERAE MARTON is a 43 y.o. female here today for female organ prolapse.)  History of Present Illness: GERMANI GAVILANES is a 43 y.o. White or Caucasian female seen in consultation at the request of Dr Langston Masker for evaluation of stage II pelvic organ prolapse and urinary incontinence.    Reports traumatic 1st delivery with PPH, resulted in retained lap Urinary leakage started after 2nd pregnancy. Largest infant 6lb Evaluated by Dr. Vernon Prey at [facility] in 2021 with stage II rectocele and stress urinary incontinence Tried size 3-5 incontinence dish pessary in the past around 5 years ago with discomfort, tried pelvic floor PT at [facility] with minimal relief Rectocele most bothersome, started splinting every 2 days around 1 year ago due to trapping of stool. Desired to have surgical intervention since 2021, however postponed due to desire for son to be older to avoid lifting. Using GLP-1 for weight loss, using metamucil 1x/wk when she feels constipated Denies fecal incontinence Lost 11lbs since 02/23/23  TVUS to r/o PCOS by Dr. Talmage Nap (endocrine) showed " uterine volume with findings c/w adenomyosis. EMS 4.64mm. 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 More bothersome Leakage 2-3x/day with urgency managed with increased void times Pad use:  1-2  pads per day.   Patient is bothered by  UI symptoms.  Day time voids >5-10 started since 2nd delivery.  Nocturia: 2-3 times per night to void. Denies snoring, LE edema Drinks water 2-3x/night Voiding dysfunction:  does not empty bladder well.  Patient does not use a catheter to empty bladder.  When urinating, patient feels dribbling after finishing managed by double voiding Drinks: 80-100oz water per day started this year, 1 espresso shot  UTIs:  0  UTI's in the last year.   Denies history of blood in urine, kidney or bladder stones, pyelonephritis, bladder cancer, and kidney cancer No results found for the last 90 days.   Pelvic Organ Prolapse Symptoms:                  Patient Admits to a feeling of a 1-2cm bulge past the vaginal opening. It has been present for 2-5 years.  Patient Admits to seeing a bulge, worsens with bowel movements This bulge is bothersome.  Bowel Symptom: Bowel movements: 2-3 time(s) per week Stool consistency: hard Straining: yes Splinting: yes, 2-3x/week Incomplete evacuation: yes.  Patient Denies accidental bowel leakage / fecal incontinence Bowel regimen: diet and fiber Not due for age based colonoscopy [facility] Colonoscopy   This patient has no relevant Health Maintenance data.     Sexual Function Sexually active: yes.  Sexual orientation: Straight Pain with sex: Yes, deep in the pelvis started around 6 months ago Improves with position change and lubrication use Leakage with movements when she is laying on her back for 1 year  Pelvic Pain Denies pelvic pain   Past Medical History:  Past Medical History:  Diagnosis Date   Depression    mild ppd  Migraine    Smoker    1-2 CIG PER DAY     Past Surgical History:   Past Surgical History:  Procedure Laterality Date   APPENDECTOMY  05/2008   DILATION AND EVACUATION N/A 09/27/2015   Procedure: DILATATION AND EVACUATION;  Surgeon: Mitchel Honour, DO;  Location: [facility];  Service: Gynecology;  Laterality: N/A;   WISDOM TOOTH  EXTRACTION       Past OB/GYN History: OB History  Gravida Para Term Preterm AB Living  3 2 2  0 1 2  SAB IAB Ectopic Multiple Live Births  0 0 0 0 2    # Outcome Date GA Lbr Len/2nd Weight Sex Type Anes PTL Lv  3 Term 07/15/17 [redacted]w[redacted]d 06:52 / 00:42 7 lb 7.8 oz (3.396 kg) M Vag-Spont EPI  LIV  2 Molar 09/2015          1 Term 07/03/12 [redacted]w[redacted]d 11:12 / 00:45 7 lb 2.5 oz (3.246 kg) F Vag-Spont EPI  LIV    Vaginal deliveries: 2, largest infant 7lb8oz, PPH with retained lap. Forceps/ Vacuum deliveries: 0, Cesarean section: 0 Menopausal: No, LMP No LMP recorded.02/23/23  Contraception: husband s/p vasectomy Last pap smear was 12/31/22.  Any history of abnormal pap smears: no. No results found for: "DIAGPAP", "HPVHIGH", "ADEQPAP"  Medications: Patient has a current medication list which includes the following prescription(s): ashwagandha, bupropion, ibuprofen, magnesium, gemtesa, gemtesa, and zepbound.   Allergies: Patient has no known allergies.   Social History:  Social History   Tobacco Use   Smoking status: Former    Current packs/day: 0.00    Types: Cigarettes    Quit date: 10/28/2011    Years since quitting: 11.4   Smokeless tobacco: Never  Vaping Use   Vaping status: Never Used  Substance Use Topics   Alcohol use: Yes    Comment: social   Drug use: No    Relationship status: married Patient lives with her husband.   Patient is employed in [employer]. 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, strength training History of abuse: No  Family History:   Family History  Problem Relation Age of Onset   Diabetes Maternal Grandmother    Cancer Maternal Grandmother        pancreas   Uterine cancer Maternal Grandmother    Skin cancer Maternal Grandmother    Diabetes Maternal Grandfather    Hypertension Maternal Grandfather    Diverticulitis Paternal Grandmother    Depression Paternal Grandmother    Alzheimer's disease Paternal Grandfather    Parkinson's  disease Paternal Grandfather    Breast cancer Neg Hx    Bladder Cancer Neg Hx      Review of Systems: Review of Systems  Constitutional:  Positive for malaise/fatigue. Negative for fever and weight loss.  Respiratory:  Negative for cough, shortness of breath and wheezing.   Cardiovascular:  Negative for chest pain, palpitations and leg swelling.  Gastrointestinal:  Positive for constipation. Negative for abdominal pain and blood in stool.  Genitourinary:  Positive for frequency and urgency. Negative for dysuria and hematuria.       Leakage, discharge  Skin:  Negative for rash.  Neurological:  Negative for dizziness, weakness and headaches.  Endo/Heme/Allergies:  Does not bruise/bleed easily.  Psychiatric/Behavioral:  Negative for depression. The patient is nervous/anxious.      OBJECTIVE Physical Exam: Vitals:   03/27/23 0946  BP: 123/82  Pulse: 86  Weight: 220 lb 9.6 oz (100.1 kg)  Height: 5\' 3"  (1.6 m)  Physical Exam Constitutional:      General: She is not in acute distress.    Appearance: Normal appearance.  Genitourinary:     Bladder and urethral meatus normal.     No lesions in the vagina.     Right Labia: No rash, tenderness, lesions, skin changes or Bartholin's cyst.    Left Labia: No tenderness, lesions, skin changes, Bartholin's cyst or rash.    No vaginal discharge, erythema, tenderness, bleeding, ulceration or granulation tissue.     Anterior and posterior vaginal prolapse present.    No vaginal atrophy present.     Right Adnexa: not tender, not full and no mass present.    Left Adnexa: not tender, not full and no mass present.    No cervical motion tenderness, discharge, friability, lesion, polyp or nabothian cyst.     Uterus is not enlarged, fixed, tender or irregular.     No uterine mass detected.    Urethral meatus caruncle not present.    Urethral stress urinary incontinence with cough stress test present.     No urethral prolapse, tenderness,  mass, hypermobility or discharge present.     Bladder is not tender, urgency on palpation not present and masses not present.      Pelvic Floor: Levator muscle strength is 2/5.    Levator ani not tender, obturator internus not tender, no asymmetrical contractions present and no pelvic spasms present.    Anal wink absent and BC reflex absent.     Symmetrical pelvic sensation. Cardiovascular:     Rate and Rhythm: Normal rate.  Pulmonary:     Effort: Pulmonary effort is normal. No respiratory distress.  Abdominal:     General: There is no distension.     Palpations: Abdomen is soft. There is no mass.     Tenderness: There is no abdominal tenderness.     Hernia: No hernia is present.    Neurological:     Mental Status: She is alert.  Psychiatric:        Mood and Affect: Mood normal.        Behavior: Behavior normal.     Comments: Tearful   Vitals reviewed. Exam conducted with a chaperone present.      POP-Q:   POP-Q  -1                                            Aa   -1                                           Ba  -7                                              C   3                                            Gh  3  Pb  10                                            tvl   1                                            Ap  1                                            Bp  -8                                              D      Rectal Exam:  Normal sphincter tone, moderate distal rectocele, enterocoele not present, no rectal masses, noted dyssynergia when asking the patient to bear down.  Post-Void Residual (PVR) by Bladder Scan: In order to evaluate bladder emptying, we discussed obtaining a postvoid residual and patient agreed to this procedure.  Procedure: The ultrasound unit was placed on the patient's abdomen in the suprapubic region after the patient had voided.    Post Void Residual - 03/27/23 0957       Post  Void Residual   Post Void Residual 73 mL            Straight Catheterization Procedure for PVR: After verbal consent was obtained from the patient for catheterization to assess bladder emptying and residual volume the urethra and surrounding tissues were prepped with betadine and an in and out catheterization was performed.  PVR was 95mL.  Urine appeared clear yellow. The patient tolerated the procedure well.   Laboratory Results: Lab Results  Component Value Date   COLORU Yellow 03/27/2023   CLARITYU Clear 03/27/2023   GLUCOSEUR Negative 03/27/2023   BILIRUBINUR Negative 03/27/2023   KETONESU Negative 03/27/2023   SPECGRAV 1.015 03/27/2023   RBCUR Negative 03/27/2023   PHUR 7.5 03/27/2023   PROTEINUR Negative 03/27/2023   UROBILINOGEN 0.2 03/27/2023   LEUKOCYTESUR Negative 03/27/2023    Lab Results  Component Value Date   CREATININE 0.86 06/07/2008    No results found for: "HGBA1C"  Lab Results  Component Value Date   HGB 8.6 (L) 07/16/2017   HbA1C 5.4 in 2023  ASSESSMENT AND PLAN Ms. Forrey is a 43 y.o. with:  1. Nocturia   2. Urinary incontinence, mixed   3. Constipation, unspecified constipation type   4. Feeling of incomplete bladder emptying   5. Pelvic organ prolapse quantification stage 2 rectocele   6. Irregular periods/menstrual cycles   7. Dyspareunia, female   8. BMI 39.0-39.9,adult     Nocturia Assessment & Plan: - avoid fluid intake after 6pm   Orders: -     POCT urinalysis dipstick -     POCT urinalysis dipstick  Urinary incontinence, mixed Assessment & Plan: - POCT clean catch UA + heme, resolved on catheterized sample - PVR 95mL - stress > urgency - For treatment of stress urinary incontinence,  non-surgical options include expectant management, weight loss, physical therapy, as well as a pessary.  Surgical options include a midurethral sling, Burch urethropexy, and transurethral injection of a bulking agent. - failed pessary due  to discomfort - we discussed an office procedure with urethral bulking (Bulkamid). We discussed success rate of approximately 70-80% and possible need for second injection. We reviewed that this is not a permanent procedure and the Bulkamid does dissolve over time. Risks reviewed including injury to bladder or urethra, UTI, urinary retention and hematuria.  - We discussed the symptoms of overactive bladder (OAB), which include urinary urgency, urinary frequency, nocturia, with or without urge incontinence.  While we do not know the exact etiology of OAB, several treatment options exist. We discussed management including behavioral therapy (decreasing bladder irritants, urge suppression strategies, timed voids, bladder retraining), physical therapy, medication; for refractory cases posterior tibial nerve stimulation, sacral neuromodulation, and intravesical botulinum toxin injection.  For anticholinergic medications, we discussed the potential side effects of anticholinergics including dry eyes, dry mouth, constipation, cognitive impairment and urinary retention. For Beta-3 agonist medication, we discussed the potential side effect of elevated blood pressure which is more likely to occur in individuals with uncontrolled hypertension. - samples and Rx Gemtesa to assess clinical change - discussed urodynamics prior to surgical intervention, pt desires to postpone until repeat vaginal exam and additional weight loss  Orders: Leslye Peer; Take 1 tablet (75 mg total) by mouth daily. Leslye Peer; Take 1 tablet (75 mg total) by mouth daily.  Dispense: 30 tablet; Refill: 2  Constipation, unspecified constipation type Assessment & Plan: - uses metamucil 1x/week - For constipation, we reviewed the importance of a better bowel regimen.  We also discussed the importance of avoiding chronic straining, as it can exacerbate her pelvic floor symptoms; we discussed treating constipation and straining prior to  surgery, as postoperative straining can lead to damage to the repair and recurrence of symptoms. We discussed initiating therapy with increasing fluid intake, fiber supplementation, stool softeners, and laxatives such as miralax.  - encouraged titration of fiber supplementation, discussed importance to optimize stool consistency prior to surgery - continue splinting as needed   Feeling of incomplete bladder emptying Assessment & Plan: - catheterized for 90mL WNL - repeat if clinical change after Gemtesa   Pelvic organ prolapse quantification stage 2 rectocele Assessment & Plan: - most bothersome symptoms from posterior vaginal wall prolapse, splints 2x/week with constipation - prior pessary trial with discomfort - For treatment of pelvic organ prolapse, we discussed options for management including expectant management, conservative management, and surgical management, such as Kegels, a pessary, pelvic floor physical therapy, and specific surgical procedures. - discussed fiber supplementation - encouraged to consider pelvic floor PT - reviewed surgical intervention options with anterior/posterior colporrhaphy. Discussed increased risk of dyspareunia - discussed need for repeat exam to r/o apical prolapse     Irregular periods/menstrual cycles Assessment & Plan: - pt reports change in cycles since weight loss - discussed need for endometrial assessment if she has persistent irregular cycles due to BMI. Encouraged pt to follow-up with Dr. Langston Masker   Dyspareunia, female Assessment & Plan: - prior pelvic floor PT at [facility] - The origin of elevated pelvic floor muscle tone can be multifactorial, including primary, reactive to a different pain source, trauma, or even part of a centralized pain syndrome.Treatment options include pelvic floor physical therapy, local (vaginal) or oral  muscle relaxants, pelvic muscle trigger point injections or centrally acting pain medications.   -  encouraged position change and lubrication use  due to improvement of discomfort - discussed possible need to return to pelvic floor PT if no resolution of symptoms - encouraged deep breathing and yoga pelvic floor relaxation exercises.   BMI 39.0-39.9,adult Assessment & Plan: - using Zepbound - active lifestyle with coaching - encouraged to continue active lifestyle and diet modification - net 11lb weight loss - discussed association of BMI with pelvic floor problems   Time spent: I spent 83 minutes dedicated to the care of this patient on the date of this encounter to include pre-visit review of records, face-to-face time with the patient discussing stage II pelvic organ prolapse, BMI, mixed urinary incontinence, constipation, dyspareunia, nocturia, and AUB, and post visit documentation and ordering medication/ testing.    Loleta Chance, MD

## 2023-03-27 NOTE — Progress Notes (Signed)
Trauma 1st delivery with PPH, resulted in retained lap Urinary leakage started after 2nd pregnancy. Largest infant 6lb Evaluated by Dr. Vernon Prey at [facility] in 2021 with stage II rectocele and stress urinary incontinence Tried pessary in the past around 5 years ago with discomfort, tried pelvic floor PT at [facility] with minimal relief Rectocele most bothersome, started splinting every 2 days around 1 year ago due to trapping of stool.  Using GLP-1 for weight loss, using metamucil  Denies fecal incontinence

## 2023-03-27 NOTE — Assessment & Plan Note (Addendum)
-   pt reports change in cycles since weight loss - discussed need for endometrial assessment if she has persistent irregular cycles due to BMI. Encouraged pt to follow-up with Dr. Langston Masker

## 2023-03-27 NOTE — Assessment & Plan Note (Addendum)
-   uses metamucil 1x/week - For constipation, we reviewed the importance of a better bowel regimen.  We also discussed the importance of avoiding chronic straining, as it can exacerbate her pelvic floor symptoms; we discussed treating constipation and straining prior to surgery, as postoperative straining can lead to damage to the repair and recurrence of symptoms. We discussed initiating therapy with increasing fluid intake, fiber supplementation, stool softeners, and laxatives such as miralax.  - encouraged titration of fiber supplementation, discussed importance to optimize stool consistency prior to surgery - continue splinting as needed

## 2023-03-27 NOTE — Assessment & Plan Note (Signed)
-   catheterized for 90mL WNL - repeat if clinical change after [facility]

## 2023-03-27 NOTE — Assessment & Plan Note (Signed)
-   prior pelvic floor PT at [facility] - The origin of elevated pelvic floor muscle tone can be multifactorial, including primary, reactive to a different pain source, trauma, or even part of a centralized pain syndrome.Treatment options include pelvic floor physical therapy, local (vaginal) or oral  muscle relaxants, pelvic muscle trigger point injections or centrally acting pain medications.   - encouraged position change and lubrication use due to improvement of discomfort - discussed possible need to return to pelvic floor PT if no resolution of symptoms - encouraged deep breathing and yoga pelvic floor relaxation exercises.

## 2023-03-31 ENCOUNTER — Ambulatory Visit
Admission: RE | Admit: 2023-03-31 | Discharge: 2023-03-31 | Disposition: A | Discharge status: Home / Self Care | Payer: BC Managed Care – PPO | Source: Ambulatory Visit | Attending: Physician Assistant | Admitting: Physician Assistant

## 2023-03-31 DIAGNOSIS — N6321 Unspecified lump in the left breast, upper outer quadrant: Secondary | ICD-10-CM | POA: Diagnosis not present

## 2023-03-31 DIAGNOSIS — N6320 Unspecified lump in the left breast, unspecified quadrant: Secondary | ICD-10-CM

## 2023-03-31 DIAGNOSIS — N6012 Diffuse cystic mastopathy of left breast: Secondary | ICD-10-CM | POA: Diagnosis not present

## 2023-03-31 DIAGNOSIS — N6325 Unspecified lump in the left breast, overlapping quadrants: Secondary | ICD-10-CM | POA: Diagnosis not present

## 2023-03-31 DIAGNOSIS — L720 Epidermal cyst: Secondary | ICD-10-CM | POA: Diagnosis not present

## 2023-04-16 DIAGNOSIS — E88819 Insulin resistance, unspecified: Secondary | ICD-10-CM | POA: Diagnosis not present

## 2023-04-21 DIAGNOSIS — M1712 Unilateral primary osteoarthritis, left knee: Secondary | ICD-10-CM | POA: Diagnosis not present

## 2023-05-01 ENCOUNTER — Encounter: Payer: Self-pay | Admitting: Obstetrics

## 2023-05-01 ENCOUNTER — Ambulatory Visit: Payer: BC Managed Care – PPO | Admitting: Obstetrics

## 2023-05-01 VITALS — BP 111/77 | HR 85

## 2023-05-01 DIAGNOSIS — R351 Nocturia: Secondary | ICD-10-CM

## 2023-05-01 DIAGNOSIS — N816 Rectocele: Secondary | ICD-10-CM

## 2023-05-01 DIAGNOSIS — K5900 Constipation, unspecified: Secondary | ICD-10-CM | POA: Diagnosis not present

## 2023-05-01 DIAGNOSIS — Z6839 Body mass index (BMI) 39.0-39.9, adult: Secondary | ICD-10-CM

## 2023-05-01 DIAGNOSIS — N3946 Mixed incontinence: Secondary | ICD-10-CM

## 2023-05-01 DIAGNOSIS — N941 Unspecified dyspareunia: Secondary | ICD-10-CM

## 2023-05-01 NOTE — Assessment & Plan Note (Signed)
-   avoid fluid intake after 6pm - reduced to 0-1x/night

## 2023-05-01 NOTE — Assessment & Plan Note (Signed)
-   worsens with cycles, described as discomfort during deep penetration - prior pelvic floor PT at [facility] - The origin of elevated pelvic floor muscle tone can be multifactorial, including primary, reactive to a different pain source, trauma, or even part of a centralized pain syndrome.Treatment options include pelvic floor physical therapy, local (vaginal) or oral  muscle relaxants, pelvic muscle trigger point injections or centrally acting pain medications.   - encouraged position change and lubrication use due to improvement of discomfort - discussed possible need to return to pelvic floor PT if no resolution of symptoms and encouraged resuming pelvic floor relaxation exercises - encouraged deep breathing and yoga pelvic floor relaxation exercises.

## 2023-05-01 NOTE — Progress Notes (Signed)
 [facility] Return Visit  SUBJECTIVE  History of Present Illness: Kerry [name] is a 43 y.o. female seen in follow-up for stage II pelvic organ prolapse, AUB, dyspareunia, constipation, nocturia, mixed urinary incontinence, sensation of incomplete bladder emptying. Plan at last visit was gemtesa, fiber supplementation, and resuming .   Reports reduction of leakage since taking Gemtesa Reduced to 1 pad use/day Reports reduction of night time frequency to 0-1x/night, voids 6-7x/day SUI 2x/week with volleyball Using 1 teaspoon metamucil/day, bowel movement 3-4x/week with reduced 0-1x/week splinting Reduced sensation of incomplete bladder emptying. Reports pain with intercourse during penetration Lost 14lbs  Consider pelvic floor PT  Past Medical History: Patient  has a past medical history of Depression, Migraine, and Smoker.   Past Surgical History: She  has a past surgical history that includes Appendectomy (05/2008); Wisdom tooth extraction; and Dilation and evacuation (N/A, 09/27/2015).   Medications: She has a current medication list which includes the following prescription(s): ashwagandha, bupropion, ibuprofen, magnesium, gemtesa, zepbound, and gemtesa.   Allergies: Patient has no known allergies.   Social History: Patient  reports that she quit smoking about 11 years ago. Her smoking use included cigarettes. She has never used smokeless tobacco. She reports current alcohol use. She reports that she does not use drugs.     OBJECTIVE     Physical Exam: Vitals:   05/01/23 1104  BP: 111/77  Pulse: 85   Gen: No apparent distress, A&O x 3.  Detailed Urogynecologic Evaluation:  Deferred. Prior exam showed:       ASSESSMENT AND PLAN    Kerry [name] is a 43 y.o. with:  1. Urinary incontinence, mixed   2. Nocturia   3. Constipation, unspecified constipation type   4. Pelvic organ prolapse quantification stage 2 rectocele   5. Dyspareunia, female   6.  BMI 39.0-39.9,adult     Urinary incontinence, mixed Assessment & Plan: - prior POCT clean catch UA + heme, resolved on catheterized sample. PVR 95mL - stress > urgency - For treatment of stress urinary incontinence,  non-surgical options include expectant management, weight loss, physical therapy, as well as a pessary.  Surgical options include a midurethral sling, Burch urethropexy, and transurethral injection of a bulking agent. - failed pessary due to discomfort - we discussed an office procedure with urethral bulking (Bulkamid). We discussed success rate of approximately 70-80% and possible need for second injection. We reviewed that this is not a permanent procedure and the Bulkamid does dissolve over time. Risks reviewed including injury to bladder or urethra, UTI, urinary retention and hematuria.  - We discussed the symptoms of overactive bladder (OAB), which include urinary urgency, urinary frequency, nocturia, with or without urge incontinence.  While we do not know the exact etiology of OAB, several treatment options exist. We discussed management including behavioral therapy (decreasing bladder irritants, urge suppression strategies, timed voids, bladder retraining), physical therapy, medication; for refractory cases posterior tibial nerve stimulation, sacral neuromodulation, and intravesical botulinum toxin injection.  For anticholinergic medications, we discussed the potential side effects of anticholinergics including dry eyes, dry mouth, constipation, cognitive impairment and urinary retention. For Beta-3 agonist medication, we discussed the potential side effect of elevated blood pressure which is more likely to occur in individuals with uncontrolled hypertension. - continue Gemtesa due to relief, pt to call if cost prohibitive. Has BP cuff at home if needed - + CST on prior exam   Nocturia Assessment & Plan: - avoid fluid intake after 6pm - reduced to  0-1x/night  Constipation, unspecified constipation type Assessment & Plan: - improved with increased dosing of metamucil daily, consider titration to optimize stool consistency and further reduce need for splinting - For constipation, we reviewed the importance of a better bowel regimen.  We also discussed the importance of avoiding chronic straining, as it can exacerbate her pelvic floor symptoms; we discussed treating constipation and straining prior to surgery, as postoperative straining can lead to damage to the repair and recurrence of symptoms. We discussed initiating therapy with increasing fluid intake, fiber supplementation, stool softeners, and laxatives such as miralax.    Pelvic organ prolapse quantification stage 2 rectocele Assessment & Plan: - most bothersome symptoms from posterior vaginal wall prolapse, splints 2x/week with constipation reduced to 1x/week with fiber supplementation  - prior pessary trial with discomfort - For treatment of pelvic organ prolapse, we discussed options for management including expectant management, conservative management, and surgical management, such as Kegels, a pessary, pelvic floor physical therapy, and specific surgical procedures. - continue fiber supplementation - encouraged to consider pelvic floor PT and resume pelvic floor relaxation exercises - reviewed surgical intervention options with anterior/posterior colporrhaphy. Discussed increased risk of dyspareunia - discussed need for repeat exam at follow-up - desires to postpone surgical intervention until 11/2023   Dyspareunia, female Assessment & Plan: - worsens with cycles, described as discomfort during deep penetration - prior pelvic floor PT at [facility] - The origin of elevated pelvic floor muscle tone can be multifactorial, including primary, reactive to a different pain source, trauma, or even part of a centralized pain syndrome.Treatment options include pelvic floor  physical therapy, local (vaginal) or oral  muscle relaxants, pelvic muscle trigger point injections or centrally acting pain medications.   - encouraged position change and lubrication use due to improvement of discomfort - discussed possible need to return to pelvic floor PT if no resolution of symptoms and encouraged resuming pelvic floor relaxation exercises - encouraged deep breathing and yoga pelvic floor relaxation exercises.   BMI 39.0-39.9,adult Assessment & Plan: - using Zepbound  - active lifestyle with coaching - encouraged to continue active lifestyle and diet modification - net 14lb weight loss - discussed association of BMI with pelvic floor problems - pt desires to continue weight loss and reassess in around 5-6 months, considering surgical intervention after 10/12  if she continues to experience symptoms due to pending vacations   Time spent: I spent 24 minutes dedicated to the care of this patient on the date of this encounter to include pre-visit review of records, face-to-face time with the patient discussing stage II pelvic organ prolapse, constipation, dyspareunia, BMI, nocturia, mixed urinary incontinence, and post visit documentation and ordering medication/ testing.    Loleta Chance, MD

## 2023-05-01 NOTE — Assessment & Plan Note (Signed)
-   using Zepbound  - active lifestyle with coaching - encouraged to continue active lifestyle and diet modification - net 14lb weight loss - discussed association of BMI with pelvic floor problems - pt desires to continue weight loss and reassess in around 5-6 months, considering surgical intervention after 10/12  if she continues to experience symptoms due to pending vacations

## 2023-05-01 NOTE — Assessment & Plan Note (Addendum)
-   prior POCT clean catch UA + heme, resolved on catheterized sample. PVR 95mL - stress > urgency - For treatment of stress urinary incontinence,  non-surgical options include expectant management, weight loss, physical therapy, as well as a pessary.  Surgical options include a midurethral sling, Burch urethropexy, and transurethral injection of a bulking agent. - failed pessary due to discomfort - we discussed an office procedure with urethral bulking (Bulkamid). We discussed success rate of approximately 70-80% and possible need for second injection. We reviewed that this is not a permanent procedure and the Bulkamid does dissolve over time. Risks reviewed including injury to bladder or urethra, UTI, urinary retention and hematuria.  - We discussed the symptoms of overactive bladder (OAB), which include urinary urgency, urinary frequency, nocturia, with or without urge incontinence.  While we do not know the exact etiology of OAB, several treatment options exist. We discussed management including behavioral therapy (decreasing bladder irritants, urge suppression strategies, timed voids, bladder retraining), physical therapy, medication; for refractory cases posterior tibial nerve stimulation, sacral neuromodulation, and intravesical botulinum toxin injection.  For anticholinergic medications, we discussed the potential side effects of anticholinergics including dry eyes, dry mouth, constipation, cognitive impairment and urinary retention. For Beta-3 agonist medication, we discussed the potential side effect of elevated blood pressure which is more likely to occur in individuals with uncontrolled hypertension. - continue Gemtesa due to relief, pt to call if cost prohibitive. Has BP cuff at home if needed - + CST on prior exam

## 2023-05-01 NOTE — Assessment & Plan Note (Signed)
-   improved with increased dosing of metamucil daily, consider titration to optimize stool consistency and further reduce need for splinting - For constipation, we reviewed the importance of a better bowel regimen.  We also discussed the importance of avoiding chronic straining, as it can exacerbate her pelvic floor symptoms; we discussed treating constipation and straining prior to surgery, as postoperative straining can lead to damage to the repair and recurrence of symptoms. We discussed initiating therapy with increasing fluid intake, fiber supplementation, stool softeners, and laxatives such as miralax.

## 2023-05-01 NOTE — Assessment & Plan Note (Signed)
-   most bothersome symptoms from posterior vaginal wall prolapse, splints 2x/week with constipation reduced to 1x/week with fiber supplementation  - prior pessary trial with discomfort - For treatment of pelvic organ prolapse, we discussed options for management including expectant management, conservative management, and surgical management, such as Kegels, a pessary, pelvic floor physical therapy, and specific surgical procedures. - continue fiber supplementation - encouraged to consider pelvic floor PT and resume pelvic floor relaxation exercises - reviewed surgical intervention options with anterior/posterior colporrhaphy. Discussed increased risk of dyspareunia - discussed need for repeat exam at follow-up - desires to postpone surgical intervention until 11/2023

## 2023-05-01 NOTE — Patient Instructions (Addendum)
 Continue weight loss, good job!!!  Therapist, music.   Continue to titrate fiber supplementation as needed to optimize stool consistency.

## 2023-05-13 ENCOUNTER — Encounter: Payer: Self-pay | Admitting: Obstetrics

## 2023-06-09 DIAGNOSIS — N926 Irregular menstruation, unspecified: Secondary | ICD-10-CM | POA: Diagnosis not present

## 2023-06-09 DIAGNOSIS — E288 Other ovarian dysfunction: Secondary | ICD-10-CM | POA: Diagnosis not present

## 2023-07-10 ENCOUNTER — Other Ambulatory Visit: Payer: Self-pay | Admitting: Endocrinology

## 2023-07-10 DIAGNOSIS — E279 Disorder of adrenal gland, unspecified: Secondary | ICD-10-CM

## 2023-07-10 DIAGNOSIS — E288 Other ovarian dysfunction: Secondary | ICD-10-CM

## 2023-07-22 ENCOUNTER — Other Ambulatory Visit: Payer: Self-pay | Admitting: Endocrinology

## 2023-07-22 DIAGNOSIS — E279 Disorder of adrenal gland, unspecified: Secondary | ICD-10-CM

## 2023-07-23 ENCOUNTER — Other Ambulatory Visit

## 2023-07-23 ENCOUNTER — Ambulatory Visit
Admission: RE | Admit: 2023-07-23 | Discharge: 2023-07-23 | Disposition: A | Discharge status: Home / Self Care | Source: Ambulatory Visit | Attending: Endocrinology | Admitting: Endocrinology

## 2023-07-23 DIAGNOSIS — D250 Submucous leiomyoma of uterus: Secondary | ICD-10-CM | POA: Diagnosis not present

## 2023-07-23 DIAGNOSIS — E288 Other ovarian dysfunction: Secondary | ICD-10-CM

## 2023-07-23 DIAGNOSIS — N83291 Other ovarian cyst, right side: Secondary | ICD-10-CM | POA: Diagnosis not present

## 2023-08-04 ENCOUNTER — Ambulatory Visit
Admission: RE | Admit: 2023-08-04 | Discharge: 2023-08-04 | Discharge status: Home / Self Care | Source: Ambulatory Visit | Attending: Endocrinology | Admitting: Endocrinology

## 2023-08-04 ENCOUNTER — Other Ambulatory Visit: Payer: Self-pay | Admitting: Endocrinology

## 2023-08-04 DIAGNOSIS — E279 Disorder of adrenal gland, unspecified: Secondary | ICD-10-CM

## 2023-09-30 DIAGNOSIS — M25562 Pain in left knee: Secondary | ICD-10-CM | POA: Diagnosis not present

## 2023-10-02 ENCOUNTER — Ambulatory Visit: Admitting: Obstetrics

## 2023-10-02 ENCOUNTER — Encounter: Payer: Self-pay | Admitting: Obstetrics

## 2023-10-02 VITALS — BP 120/78 | HR 76

## 2023-10-02 DIAGNOSIS — R3914 Feeling of incomplete bladder emptying: Secondary | ICD-10-CM

## 2023-10-02 DIAGNOSIS — N3946 Mixed incontinence: Secondary | ICD-10-CM

## 2023-10-02 DIAGNOSIS — K5900 Constipation, unspecified: Secondary | ICD-10-CM

## 2023-10-02 DIAGNOSIS — N941 Unspecified dyspareunia: Secondary | ICD-10-CM | POA: Diagnosis not present

## 2023-10-02 DIAGNOSIS — N816 Rectocele: Secondary | ICD-10-CM | POA: Diagnosis not present

## 2023-10-02 DIAGNOSIS — N926 Irregular menstruation, unspecified: Secondary | ICD-10-CM

## 2023-10-02 NOTE — Assessment & Plan Note (Signed)
-   worsens with cycles, described as discomfort during deep penetration - prior pelvic floor PT at [facility], encouraged to resume pelvic floor relaxation exercises - The origin of elevated pelvic floor muscle tone can be multifactorial, including primary, reactive to a different pain source, trauma, or even part of a centralized pain syndrome.Treatment options include pelvic floor physical therapy, local (vaginal) or oral  muscle relaxants, pelvic muscle trigger point injections or centrally acting pain medications.   - encouraged position change and lubrication use due to improvement of discomfort - encouraged deep breathing and yoga pelvic floor relaxation exercises - discussed risk of de novo or worsening dyspareunia postop that may require additional treatment and pelvic floor PT

## 2023-10-02 NOTE — Patient Instructions (Signed)
 We discussed prolapse repair:  1) vaginal repair without mesh - Pros - safer, no mesh complications - Cons - not as strong as mesh repair, higher risk of recurrence  For stress urinary incontinence: 1) Urethral bulking (Bulkamid). We discussed success rate of approximately 70-80% and possible need for second injection. We reviewed that this is not a permanent procedure and the Bulkamid does become less effective over time. Risks reviewed including injury to bladder or urethra, UTI, urinary retention and hematuria.   2) Sling: The effectiveness of a midurethral vaginal mesh sling is approximately 85%, and thus, there will be times when you may leak urine after surgery, especially if your bladder is full or if you have a strong cough. There is a balance between making the sling tight enough to treat your leakage but not too tight so that you have long-term difficulty emptying your bladder. A mesh sling will not directly treat overactive bladder/urge incontinence and may worsen it.  There is an FDA safety notification on vaginal mesh procedures for prolapse but NOT mesh slings. We have extensive experience and training with mesh placement and we have close postoperative follow up to identify any potential complications from mesh. It is important to realize that this mesh is a permanent implant that cannot be easily removed. There are rare risks of mesh exposure (2-4%), pain with intercourse (0-7%), and infection (<1%). The risk of mesh exposure if more likely in a woman with risks for poor healing (prior radiation, poorly controlled diabetes, or immunocompromised). The risk of new or worsened chronic pain after mesh implant is more common in women with baseline chronic pain and/or poorly controlled anxiety or depression. Approximately 2-4% of patients will experience longer-term post-operative voiding dysfunction that may require surgical revision of the sling. We also reviewed that postoperatively, her stream  may not be as strong as before surgery.

## 2023-10-02 NOTE — Assessment & Plan Note (Signed)
-   prior POCT clean catch UA + heme, resolved on catheterized sample. PVR 95mL - stress > urgency - For treatment of stress urinary incontinence,  non-surgical options include expectant management, weight loss, physical therapy, as well as a pessary.  Surgical options include a midurethral sling, Burch urethropexy, and transurethral injection of a bulking agent. - failed pessary due to discomfort - we discussed an office procedure with urethral bulking (Bulkamid). We discussed success rate of approximately 70-80% and possible need for second injection. We reviewed that this is not a permanent procedure and the Bulkamid does dissolve over time. Risks reviewed including injury to bladder or urethra, UTI, urinary retention and hematuria.  - Sling: The effectiveness of a midurethral vaginal mesh sling is approximately 85%, and thus, there will be times when you may leak urine after surgery, especially if your bladder is full or if you have a strong cough. There is a balance between making the sling tight enough to treat your leakage but not too tight so that you have long-term difficulty emptying your bladder. A mesh sling will not directly treat overactive bladder/urge incontinence and may worsen it.  There is an FDA safety notification on vaginal mesh procedures for prolapse but NOT mesh slings. We have extensive experience and training with mesh placement and we have close postoperative follow up to identify any potential complications from mesh. It is important to realize that this mesh is a permanent implant that cannot be easily removed. There are rare risks of mesh exposure (2-4%), pain with intercourse (0-7%), and infection (<1%). The risk of mesh exposure if more likely in a woman with risks for poor healing (prior radiation, poorly controlled diabetes, or immunocompromised). The risk of new or worsened chronic pain after mesh implant is more common in women with baseline chronic pain and/or poorly  controlled anxiety or depression. Approximately 2-4% of patients will experience longer-term post-operative voiding dysfunction that may require surgical revision of the sling. We also reviewed that postoperatively, her stream may not be as strong as before surgery.  - pt desires to proceed with midurethral sling - We discussed the symptoms of overactive bladder (OAB), which include urinary urgency, urinary frequency, nocturia, with or without urge incontinence.  While we do not know the exact etiology of OAB, several treatment options exist. We discussed management including behavioral therapy (decreasing bladder irritants, urge suppression strategies, timed voids, bladder retraining), physical therapy, medication; for refractory cases posterior tibial nerve stimulation, sacral neuromodulation, and intravesical botulinum toxin injection.  For anticholinergic medications, we discussed the potential side effects of anticholinergics including dry eyes, dry mouth, constipation, cognitive impairment and urinary retention. For Beta-3 agonist medication, we discussed the potential side effect of elevated blood pressure which is more likely to occur in individuals with uncontrolled hypertension. - prior use of Gemtesa  with reduction of SUI and UUI, cost prohibitive - + CST on exam

## 2023-10-02 NOTE — Assessment & Plan Note (Signed)
-   pt reports change in cycles since weight loss - discussed need for endometrial assessment if she has persistent irregular cycles due to BMI. Encouraged pt to follow-up with Dr. Dannielle - denies history of abnormal pap smears

## 2023-10-02 NOTE — Assessment & Plan Note (Addendum)
-   improved with increased dosing of metamucil to 4 capsules daily, continue titration as needed to optimize stool consistency and further reduce need for splinting - For constipation, we reviewed the importance of a better bowel regimen.  We also discussed the importance of avoiding chronic straining, as it can exacerbate her pelvic floor symptoms; we discussed treating constipation and straining prior to surgery, as postoperative straining can lead to damage to the repair and recurrence of symptoms. We discussed initiating therapy with increasing fluid intake, fiber supplementation, stool softeners, and laxatives such as miralax.  - discussed possible need for additional treatment postop

## 2023-10-02 NOTE — Assessment & Plan Note (Signed)
-   prior catheterization for 90mL WNL - improved with Gemtesa , increased since discontinuation - discussed possible need for additional treatment postop

## 2023-10-02 NOTE — Progress Notes (Signed)
 [facility] Return Visit  SUBJECTIVE  History of Present Illness: Kerry [name] is a 43 y.o. female seen in follow-up for stage II pelvic organ prolapse, AUB, dyspareunia, constipation, nocturia, mixed urinary incontinence, sensation of incomplete bladder emptying. Plan at last visit was continue gemtesa , titration of fiber supplementation, and repeat exam for surgical intervention.   Stress urinary leakage > urgency Reports reduction of leakage to 1 pad use/day since taking Gemtesa , stopped after running out of Gemtesa  and UUI 2-3x/day continues to use 1-2 pad/day especially with exercises managed by increased timed voids Reports reduction of night time frequency to 1x/night, voids 6-7x/day up to 10-12x/day Avoiding fluid intake 3 hours before bedtime SUI 2x/week with volleyball on Gemtesa , increased to 5-7x/day during workout since discontinuation of Gemtesa  Switched from 1 teaspoon metamucil/day to 4 capsules/day, bowel movement 3-4x/week with reduced 0-1x/week splinting since start fiber supplementation. Cycles worsen urinary leakage and constipation Prior reduced sensation of incomplete bladder emptying on gemtesa  Reports pain with intercourse during penetration, s/p prior pelvic floor PT at [facility] Lost net 47lbs on Zepbound Denies to proceed with surgical intervention  POPQ 03/14/19 by Dr. Katharina:  Aa -1 Ba -2 Ap 0 Bp -1 C -10 TVL 12 GH 4.5 PB 1 cm (absent perineal body)   Past Medical History: Patient  has a past medical history of Depression, Migraine, Ovarian cyst, Smoker, and Uterine fibroid.   Past Surgical History: She  has a past surgical history that includes Appendectomy (05/2008); Wisdom tooth extraction; and Dilation and evacuation (N/A, 09/27/2015).   Medications: She has a current medication list which includes the following prescription(s): ashwagandha, bupropion, ibuprofen , magnesium, zepbound, and gemtesa .   Allergies: Patient  has no known allergies.   Social History: Patient  reports that she quit smoking about 11 years ago. Her smoking use included cigarettes. She has never used smokeless tobacco. She reports current alcohol use. She reports that she does not use drugs.     OBJECTIVE     Physical Exam: Vitals:   10/02/23 0916  BP: 120/78  Pulse: 76    Gen: No apparent distress, A&O x 3.  POP-Q  -2                                            Aa   -2                                           Ba  -8                                              C   4                                            Gh  3  Pb  10                                            tvl   0                                            Ap  0                                            Bp  -9                                              D   C -8 standing  CST positive with minimal cough and valsalva      ASSESSMENT AND PLAN    [name] is a 43 y.o. with:  1. Pelvic organ prolapse quantification stage 2 rectocele   2. Constipation, unspecified constipation type   3. Urinary incontinence, mixed   4. Dyspareunia, female   5. Feeling of incomplete bladder emptying   6. Irregular periods/menstrual cycles      Pelvic organ prolapse quantification stage 2 rectocele Assessment & Plan: - most bothersome symptoms from posterior vaginal wall prolapse, splints 2x/week with constipation reduced to 0-1x/week with fiber supplementation  - prior pessary trial with discomfort - For treatment of pelvic organ prolapse, we discussed options for management including expectant management, conservative management, and surgical management, such as Kegels, a pessary, pelvic floor physical therapy, and specific surgical procedures. - continue titration fiber supplementation and discussed need to monitor for constipation postop - encouraged to consider pelvic floor PT and resume pelvic floor  relaxation exercises - repeat exam standing with good apical support - reviewed surgical intervention options with risks and benefits of anterior/posterior colporrhaphy. Discussed increased risk of dyspareunia. Through joint decision making, patient desires to proceed - desires to postpone surgical intervention until 10-12/2023 after vacation   Constipation, unspecified constipation type Assessment & Plan: - improved with increased dosing of metamucil to 4 capsules daily, continue titration as needed to optimize stool consistency and further reduce need for splinting - For constipation, we reviewed the importance of a better bowel regimen.  We also discussed the importance of avoiding chronic straining, as it can exacerbate her pelvic floor symptoms; we discussed treating constipation and straining prior to surgery, as postoperative straining can lead to damage to the repair and recurrence of symptoms. We discussed initiating therapy with increasing fluid intake, fiber supplementation, stool softeners, and laxatives such as miralax.  - discussed possible need for additional treatment postop   Urinary incontinence, mixed Assessment & Plan: - prior POCT clean catch UA + heme, resolved on catheterized sample. PVR 95mL - stress > urgency - For treatment of stress urinary incontinence,  non-surgical options include expectant management, weight loss, physical therapy, as well as a pessary.  Surgical options include a midurethral sling, Burch urethropexy, and transurethral injection of a bulking agent. - failed pessary due to discomfort - we discussed an office procedure with urethral bulking (Bulkamid). We discussed success  rate of approximately 70-80% and possible need for second injection. We reviewed that this is not a permanent procedure and the Bulkamid does dissolve over time. Risks reviewed including injury to bladder or urethra, UTI, urinary retention and hematuria.  - Sling: The effectiveness  of a midurethral vaginal mesh sling is approximately 85%, and thus, there will be times when you may leak urine after surgery, especially if your bladder is full or if you have a strong cough. There is a balance between making the sling tight enough to treat your leakage but not too tight so that you have long-term difficulty emptying your bladder. A mesh sling will not directly treat overactive bladder/urge incontinence and may worsen it.  There is an FDA safety notification on vaginal mesh procedures for prolapse but NOT mesh slings. We have extensive experience and training with mesh placement and we have close postoperative follow up to identify any potential complications from mesh. It is important to realize that this mesh is a permanent implant that cannot be easily removed. There are rare risks of mesh exposure (2-4%), pain with intercourse (0-7%), and infection (<1%). The risk of mesh exposure if more likely in a woman with risks for poor healing (prior radiation, poorly controlled diabetes, or immunocompromised). The risk of new or worsened chronic pain after mesh implant is more common in women with baseline chronic pain and/or poorly controlled anxiety or depression. Approximately 2-4% of patients will experience longer-term post-operative voiding dysfunction that may require surgical revision of the sling. We also reviewed that postoperatively, her stream may not be as strong as before surgery.  - pt desires to proceed with midurethral sling - We discussed the symptoms of overactive bladder (OAB), which include urinary urgency, urinary frequency, nocturia, with or without urge incontinence.  While we do not know the exact etiology of OAB, several treatment options exist. We discussed management including behavioral therapy (decreasing bladder irritants, urge suppression strategies, timed voids, bladder retraining), physical therapy, medication; for refractory cases posterior tibial nerve stimulation,  sacral neuromodulation, and intravesical botulinum toxin injection.  For anticholinergic medications, we discussed the potential side effects of anticholinergics including dry eyes, dry mouth, constipation, cognitive impairment and urinary retention. For Beta-3 agonist medication, we discussed the potential side effect of elevated blood pressure which is more likely to occur in individuals with uncontrolled hypertension. - prior use of Gemtesa  with reduction of SUI and UUI, cost prohibitive - + CST on exam   Dyspareunia, female Assessment & Plan: - worsens with cycles, described as discomfort during deep penetration - prior pelvic floor PT at [facility], encouraged to resume pelvic floor relaxation exercises - The origin of elevated pelvic floor muscle tone can be multifactorial, including primary, reactive to a different pain source, trauma, or even part of a centralized pain syndrome.Treatment options include pelvic floor physical therapy, local (vaginal) or oral  muscle relaxants, pelvic muscle trigger point injections or centrally acting pain medications.   - encouraged position change and lubrication use due to improvement of discomfort - encouraged deep breathing and yoga pelvic floor relaxation exercises - discussed risk of de novo or worsening dyspareunia postop that may require additional treatment and pelvic floor PT   Feeling of incomplete bladder emptying Assessment & Plan: - prior catheterization for 90mL WNL - improved with Gemtesa , increased since discontinuation - discussed possible need for additional treatment postop   Irregular periods/menstrual cycles Assessment & Plan: - pt reports change in cycles since weight loss - discussed need for endometrial  assessment if she has persistent irregular cycles due to BMI. Encouraged pt to follow-up with Dr. Dannielle - denies history of abnormal pap smears   Plan for surgery: Exam under anesthesia, posterior repair,  perineoplasty,  possible anterior repair, midurethral sling, cystourethroscopy   - We reviewed the patient's specific anatomic and functional findings, with the assistance of diagrams, and together finalized the above procedure. The planned surgical procedures were discussed along with the surgical risks outlined below, which were also provided on a detailed handout. Additional treatment options including expectant management, conservative management, medical management were discussed where appropriate.  We reviewed the benefits and risks of each treatment option.   General Surgical Risks: For all procedures, there are risks of bleeding, infection, damage to surrounding organs including but not limited to bowel, bladder, blood vessels, ureters and nerves, and need for further surgery if an injury were to occur. These risks are all low with minimally invasive surgery.   There are risks of numbness and weakness at any body site or buttock/rectal pain.  It is possible that baseline pain can be worsened by surgery, either with or without mesh. If surgery is vaginal, there is also a low risk of possible conversion to laparoscopy or open abdominal incision where indicated. Very rare risks include blood transfusion, blood clot, heart attack, pneumonia, or death.   There is also a risk of short-term postoperative urinary retention with need to use a catheter. About half of patients need to go home from surgery with a catheter, which is then later removed in the office. The risk of long-term need for a catheter is very low. There is also a risk of worsening of overactive bladder.   Sling: The effectiveness of a midurethral vaginal mesh sling is approximately 85%, and thus, there will be times when you may leak urine after surgery, especially if your bladder is full or if you have a strong cough. There is a balance between making the sling tight enough to treat your leakage but not too tight so that you have  long-term difficulty emptying your bladder. A mesh sling will not directly treat overactive bladder/urge incontinence and may worsen it.  There is an FDA safety notification on vaginal mesh procedures for prolapse but NOT mesh slings. We have extensive experience and training with mesh placement and we have close postoperative follow up to identify any potential complications from mesh. It is important to realize that this mesh is a permanent implant that cannot be easily removed. There are rare risks of mesh exposure (2-4%), pain with intercourse (0-7%), and infection (<1%). The risk of mesh exposure if more likely in a woman with risks for poor healing (prior radiation, poorly controlled diabetes, or immunocompromised). The risk of new or worsened chronic pain after mesh implant is more common in women with baseline chronic pain and/or poorly controlled anxiety or depression. Approximately 2-4% of patients will experience longer-term post-operative voiding dysfunction that may require surgical revision of the sling. We also reviewed that postoperatively, her stream may not be as strong as before surgery.   Prolapse (with or without mesh): Risk factors for surgical failure  include things that put pressure on your pelvis and the surgical repair, including obesity, chronic cough, and heavy lifting or straining (including lifting children or adults, straining on the toilet, or lifting heavy objects such as furniture or anything weighing >25 lbs. Risks of recurrence is 20-30% with vaginal native tissue repair and a less than 10% with sacrocolpopexy with mesh.    -  For preop Visit:  She is required to have a visit within 30 days of her surgery.   Today we reviewed pre-operative preparation, peri-operative expectations, and post-operative instructions/recovery.  She was provided with instructional handouts. She understands not to take aspirin (>81mg ) or NSAIDs 7 days prior to surgery. Prescriptions will be  provided for: Oxycodone  5mg , Ibuprofen  600mg , Tylenol  500mg , Miralax. These prescriptions will be sent prior to surgery.  - Medical clearance: not required  - Anticoagulant use: No - Medicaid Hysterectomy form: No - Accepts blood transfusion: Yes - Expected length of stay: outpatient  Request sent for surgery scheduling.   Lianne ONEIDA Gillis, MD  Time spent: I spent 44 minutes dedicated to the care of this patient on the date of this encounter to include pre-visit review of records, face-to-face time with the patient discussing stage II pelvic organ prolapse, constipation, dyspareunia, BMI, nocturia, mixed urinary incontinence, and post visit documentation and ordering medication/ testing.

## 2023-10-02 NOTE — Assessment & Plan Note (Signed)
-   most bothersome symptoms from posterior vaginal wall prolapse, splints 2x/week with constipation reduced to 0-1x/week with fiber supplementation  - prior pessary trial with discomfort - For treatment of pelvic organ prolapse, we discussed options for management including expectant management, conservative management, and surgical management, such as Kegels, a pessary, pelvic floor physical therapy, and specific surgical procedures. - continue titration fiber supplementation and discussed need to monitor for constipation postop - encouraged to consider pelvic floor PT and resume pelvic floor relaxation exercises - repeat exam standing with good apical support - reviewed surgical intervention options with risks and benefits of anterior/posterior colporrhaphy. Discussed increased risk of dyspareunia. Through joint decision making, patient desires to proceed - desires to postpone surgical intervention until 10-12/2023 after vacation

## 2023-10-07 ENCOUNTER — Telehealth: Payer: Self-pay | Admitting: Obstetrics

## 2023-10-07 NOTE — Telephone Encounter (Signed)
 Patient called in saying she hadn't heard about scheduling surgery.  She said Dr. Guadlupe said someone would call her this week.  She is wanting surgery Oct/Nov.  I told her that it could take a few weeks to get scheduled and that scheduler only worked a couple days a week.  I told her I would send a message back.

## 2023-10-08 DIAGNOSIS — N926 Irregular menstruation, unspecified: Secondary | ICD-10-CM | POA: Diagnosis not present

## 2023-10-08 DIAGNOSIS — E288 Other ovarian dysfunction: Secondary | ICD-10-CM | POA: Diagnosis not present

## 2023-10-08 DIAGNOSIS — R7989 Other specified abnormal findings of blood chemistry: Secondary | ICD-10-CM | POA: Diagnosis not present

## 2023-10-08 DIAGNOSIS — R635 Abnormal weight gain: Secondary | ICD-10-CM | POA: Diagnosis not present

## 2023-10-13 ENCOUNTER — Ambulatory Visit: Admitting: Obstetrics

## 2023-10-13 NOTE — Telephone Encounter (Signed)
 Pt calling back to say she didn't hear anything back from last time she called about scheduling surgery. Told her I would send another message.

## 2023-10-19 ENCOUNTER — Encounter: Payer: Self-pay | Admitting: Obstetrics

## 2023-11-03 DIAGNOSIS — G43119 Migraine with aura, intractable, without status migrainosus: Secondary | ICD-10-CM | POA: Diagnosis not present

## 2023-11-03 DIAGNOSIS — R6882 Decreased libido: Secondary | ICD-10-CM | POA: Diagnosis not present

## 2023-11-24 NOTE — Telephone Encounter (Signed)
 Pt has been communicated with by MyChart for surgery scheduling KD

## 2023-12-01 DIAGNOSIS — D219 Benign neoplasm of connective and other soft tissue, unspecified: Secondary | ICD-10-CM | POA: Diagnosis not present

## 2023-12-01 DIAGNOSIS — E282 Polycystic ovarian syndrome: Secondary | ICD-10-CM | POA: Diagnosis not present

## 2023-12-01 DIAGNOSIS — N939 Abnormal uterine and vaginal bleeding, unspecified: Secondary | ICD-10-CM | POA: Diagnosis not present

## 2023-12-01 DIAGNOSIS — N946 Dysmenorrhea, unspecified: Secondary | ICD-10-CM | POA: Diagnosis not present

## 2023-12-04 ENCOUNTER — Other Ambulatory Visit: Payer: Self-pay | Admitting: Obstetrics and Gynecology

## 2023-12-04 ENCOUNTER — Ambulatory Visit (INDEPENDENT_AMBULATORY_CARE_PROVIDER_SITE_OTHER): Admitting: Obstetrics and Gynecology

## 2023-12-04 ENCOUNTER — Encounter: Payer: Self-pay | Admitting: Obstetrics and Gynecology

## 2023-12-04 VITALS — BP 117/78 | HR 85 | Ht 62.0 in | Wt 176.0 lb

## 2023-12-04 DIAGNOSIS — Z01818 Encounter for other preprocedural examination: Secondary | ICD-10-CM

## 2023-12-04 DIAGNOSIS — N816 Rectocele: Secondary | ICD-10-CM

## 2023-12-04 MED ORDER — ACETAMINOPHEN 500 MG PO TABS: 500.0000 mg | ORAL_TABLET | Freq: Four times a day (QID) | ORAL | 0 refills | Status: DC | PRN

## 2023-12-04 MED ORDER — POLYETHYLENE GLYCOL 3350 17 GM/SCOOP PO POWD: 17.0000 g | Freq: Every day | ORAL | 0 refills | Status: AC

## 2023-12-04 MED ORDER — OXYCODONE HCL 5 MG PO TABS: 5.0000 mg | ORAL_TABLET | ORAL | 0 refills | Status: DC | PRN

## 2023-12-04 MED ORDER — IBUPROFEN 600 MG PO TABS: 600.0000 mg | ORAL_TABLET | Freq: Four times a day (QID) | ORAL | 0 refills | Status: DC | PRN

## 2023-12-04 NOTE — Progress Notes (Signed)
 [facility] Pre-Operative Exam  Subjective Chief Complaint: Kerry [name] presents for a preoperative encounter.   History of Present Illness: Kerry [name] is a 43 y.o. female who presents for preoperative visit.  She is scheduled to undergo Exam under anesthesia, posterior repair, perineoplasty,  possible anterior repair, midurethral sling, cystourethroscopy  on 12/17/23.  Her symptoms include Pelvic organ prolapse and stress urinary incontinence, and she was was found to have Stage I anterior, Stage III posterior, Stage I apical prolapse.   Urodynamics showed: Deferred, + CST on prior exam   Past Medical History:  Diagnosis Date   Depression    mild ppd   Migraine    Ovarian cyst    Smoker    1-2 CIG PER DAY   Uterine fibroid      Past Surgical History:  Procedure Laterality Date   APPENDECTOMY  05/2008   DILATION AND EVACUATION N/A 09/27/2015   Procedure: DILATATION AND EVACUATION;  Surgeon: Duwaine Blumenthal, DO;  Location: [facility];  Service: Gynecology;  Laterality: N/A;   WISDOM TOOTH EXTRACTION      has no known allergies.   Family History  Problem Relation Age of Onset   Diabetes Maternal Grandmother    Cancer Maternal Grandmother        pancreas   Uterine cancer Maternal Grandmother    Skin cancer Maternal Grandmother    Diabetes Maternal Grandfather    Hypertension Maternal Grandfather    Diverticulitis Paternal Grandmother    Depression Paternal Grandmother    Alzheimer's disease Paternal Grandfather    Parkinson's disease Paternal Grandfather    Breast cancer Neg Hx    Bladder Cancer Neg Hx     Social History   Tobacco Use   Smoking status: Former    Current packs/day: 0.00    Types: Cigarettes    Quit date: 10/28/2011    Years since quitting: 12.1   Smokeless tobacco: Never  Vaping Use   Vaping status: Never Used  Substance Use Topics   Alcohol use: Yes    Comment: social   Drug use: No     Review of Systems was negative for a  full 10 system review except as noted in the History of Present Illness.   Current Outpatient Medications:    Ashwagandha 500 MG CAPS, as directed Orally, Disp: , Rfl:    buPROPion (WELLBUTRIN XL) 150 MG 24 hr tablet, Take 150 mg by mouth daily., Disp: , Rfl:    ibuprofen  (ADVIL ,MOTRIN ) 600 MG tablet, Take 1 tablet (600 mg total) by mouth every 6 (six) hours., Disp: 30 tablet, Rfl: 0   Magnesium 200 MG TABS, 2 tablets with a meal Orally Once a day, Disp: , Rfl:    ZEPBOUND 15 MG/0.5ML Pen, Inject into the skin., Disp: , Rfl:    Objective Vitals:   12/04/23 1139  BP: 117/78  Pulse: 85    Gen: NAD CV: S1 S2 RRR Lungs: Clear to auscultation bilaterally Abd: soft, nontender   Previous Pelvic Exam showed: POP-Q   -1                                            Aa   -1  Ba   -7                                              C    3                                            Gh   3                                            Pb   10                                            tvl    1                                            Ap   1                                            Bp   -8                                          Assessment/ Plan  Assessment: The patient is a 43 y.o. year old scheduled to undergo Exam under anesthesia, posterior repair, perineoplasty,  possible anterior repair, midurethral sling, cystourethroscopy. Verbal consent was obtained for these procedures.  Plan: General Surgical Consent: The patient has previously been counseled on alternative treatments, and the decision by the patient and provider was to proceed with the procedure listed above.  For all procedures, there are risks of bleeding, infection, damage to surrounding organs including but not limited to bowel, bladder, blood vessels, ureters and nerves, and need for further surgery if an injury were to occur. These risks are all low with minimally  invasive surgery.   There are risks of numbness and weakness at any body site or buttock/rectal pain.  It is possible that baseline pain can be worsened by surgery, either with or without mesh. If surgery is vaginal, there is also a low risk of possible conversion to laparoscopy or open abdominal incision where indicated. Very rare risks include blood transfusion, blood clot, heart attack, pneumonia, or death.   There is also a risk of short-term postoperative urinary retention with need to use a catheter. About half of patients need to go home from surgery with a catheter, which is then later removed in the office. The risk of long-term need for a catheter is very low. There is also a risk of worsening of overactive bladder.   Sling: The effectiveness of a midurethral vaginal mesh sling is approximately 85%, and thus, there will be times when you may leak urine after surgery, especially if your  bladder is full or if you have a strong cough. There is a balance between making the sling tight enough to treat your leakage but not too tight so that you have long-term difficulty emptying your bladder. A mesh sling will not directly treat overactive bladder/urge incontinence and may worsen it.  There is an FDA safety notification on vaginal mesh procedures for prolapse but NOT mesh slings. We have extensive experience and training with mesh placement and we have close postoperative follow up to identify any potential complications from mesh. It is important to realize that this mesh is a permanent implant that cannot be easily removed. There are rare risks of mesh exposure (2-4%), pain with intercourse (0-7%), and infection (<1%). The risk of mesh exposure if more likely in a woman with risks for poor healing (prior radiation, poorly controlled diabetes, or immunocompromised). The risk of new or worsened chronic pain after mesh implant is more common in women with baseline chronic pain and/or poorly controlled  anxiety or depression. Approximately 2-4% of patients will experience longer-term post-operative voiding dysfunction that may require surgical revision of the sling. We also reviewed that postoperatively, her stream may not be as strong as before surgery.   Prolapse (with or without mesh): Risk factors for surgical failure  include things that put pressure on your pelvis and the surgical repair, including obesity, chronic cough, and heavy lifting or straining (including lifting children or adults, straining on the toilet, or lifting heavy objects such as furniture or anything weighing >25 lbs. Risks of recurrence is 20-30% with vaginal native tissue repair and a less than 10% with sacrocolpopexy with mesh.     We discussed consent for blood products. Risks for blood transfusion include allergic reactions, other reactions that can affect different body organs and managed accordingly, transmission of infectious diseases such as HIV or Hepatitis. However, the blood is screened. Patient consents for blood products.  Pre-operative instructions:  She was instructed to not take Aspirin/NSAIDs x 7days prior to surgery. Hold Zepbound until after she has a bowel movement post surgery. Antibiotic prophylaxis was ordered as indicated.  Catheter use: Patient will go home with foley if needed after post-operative voiding trial.  Post-operative instructions:  She was provided with specific post-operative instructions, including precautions and signs/symptoms for which we would recommend contacting us , in addition to daytime and after-hours contact phone numbers. This was provided on a handout.   Post-operative medications: Prescriptions for motrin , tylenol , miralax, and oxycodone  were sent to her pharmacy. Discussed using ibuprofen  and tylenol  on a schedule to limit use of narcotics.   Laboratory testing:  We will check labs: As requested by anesthesia.   Preoperative clearance:  She does not require surgical  clearance.    Post-operative follow-up:  A post-operative appointment will be made for 6 weeks from the date of surgery. If she needs a post-operative nurse visit for a voiding trial, that will be set up after she leaves the hospital.    Patient will call the clinic or use MyChart should anything change or any new issues arise.   Chelsa Stout G Willy Pinkerton, NP

## 2023-12-04 NOTE — Addendum Note (Signed)
 Addended by: Aryn Safran G on: 12/04/2023 12:17 PM   Modules accepted: Orders

## 2023-12-04 NOTE — H&P (View-Only) (Signed)
 [facility] Pre-Operative Exam  Subjective Chief Complaint: Kerry [name] presents for a preoperative encounter.   History of Present Illness: Kerry [name] is a 43 y.o. female who presents for preoperative visit.  She is scheduled to undergo Exam under anesthesia, posterior repair, perineoplasty,  possible anterior repair, midurethral sling, cystourethroscopy  on 12/17/23.  Her symptoms include Pelvic organ prolapse and stress urinary incontinence, and she was was found to have Stage I anterior, Stage III posterior, Stage I apical prolapse.   Urodynamics showed: Deferred, + CST on prior exam   Past Medical History:  Diagnosis Date   Depression    mild ppd   Migraine    Ovarian cyst    Smoker    1-2 CIG PER DAY   Uterine fibroid      Past Surgical History:  Procedure Laterality Date   APPENDECTOMY  05/2008   DILATION AND EVACUATION N/A 09/27/2015   Procedure: DILATATION AND EVACUATION;  Surgeon: Duwaine Blumenthal, DO;  Location: [facility];  Service: Gynecology;  Laterality: N/A;   WISDOM TOOTH EXTRACTION      has no known allergies.   Family History  Problem Relation Age of Onset   Diabetes Maternal Grandmother    Cancer Maternal Grandmother        pancreas   Uterine cancer Maternal Grandmother    Skin cancer Maternal Grandmother    Diabetes Maternal Grandfather    Hypertension Maternal Grandfather    Diverticulitis Paternal Grandmother    Depression Paternal Grandmother    Alzheimer's disease Paternal Grandfather    Parkinson's disease Paternal Grandfather    Breast cancer Neg Hx    Bladder Cancer Neg Hx     Social History   Tobacco Use   Smoking status: Former    Current packs/day: 0.00    Types: Cigarettes    Quit date: 10/28/2011    Years since quitting: 12.1   Smokeless tobacco: Never  Vaping Use   Vaping status: Never Used  Substance Use Topics   Alcohol use: Yes    Comment: social   Drug use: No     Review of Systems was negative for a  full 10 system review except as noted in the History of Present Illness.   Current Outpatient Medications:    Ashwagandha 500 MG CAPS, as directed Orally, Disp: , Rfl:    buPROPion (WELLBUTRIN XL) 150 MG 24 hr tablet, Take 150 mg by mouth daily., Disp: , Rfl:    ibuprofen  (ADVIL ,MOTRIN ) 600 MG tablet, Take 1 tablet (600 mg total) by mouth every 6 (six) hours., Disp: 30 tablet, Rfl: 0   Magnesium 200 MG TABS, 2 tablets with a meal Orally Once a day, Disp: , Rfl:    ZEPBOUND 15 MG/0.5ML Pen, Inject into the skin., Disp: , Rfl:    Objective Vitals:   12/04/23 1139  BP: 117/78  Pulse: 85    Gen: NAD CV: S1 S2 RRR Lungs: Clear to auscultation bilaterally Abd: soft, nontender   Previous Pelvic Exam showed: POP-Q   -1                                            Aa   -1  Ba   -7                                              C    3                                            Gh   3                                            Pb   10                                            tvl    1                                            Ap   1                                            Bp   -8                                          Assessment/ Plan  Assessment: The patient is a 43 y.o. year old scheduled to undergo Exam under anesthesia, posterior repair, perineoplasty,  possible anterior repair, midurethral sling, cystourethroscopy. Verbal consent was obtained for these procedures.  Plan: General Surgical Consent: The patient has previously been counseled on alternative treatments, and the decision by the patient and provider was to proceed with the procedure listed above.  For all procedures, there are risks of bleeding, infection, damage to surrounding organs including but not limited to bowel, bladder, blood vessels, ureters and nerves, and need for further surgery if an injury were to occur. These risks are all low with minimally  invasive surgery.   There are risks of numbness and weakness at any body site or buttock/rectal pain.  It is possible that baseline pain can be worsened by surgery, either with or without mesh. If surgery is vaginal, there is also a low risk of possible conversion to laparoscopy or open abdominal incision where indicated. Very rare risks include blood transfusion, blood clot, heart attack, pneumonia, or death.   There is also a risk of short-term postoperative urinary retention with need to use a catheter. About half of patients need to go home from surgery with a catheter, which is then later removed in the office. The risk of long-term need for a catheter is very low. There is also a risk of worsening of overactive bladder.   Sling: The effectiveness of a midurethral vaginal mesh sling is approximately 85%, and thus, there will be times when you may leak urine after surgery, especially if your  bladder is full or if you have a strong cough. There is a balance between making the sling tight enough to treat your leakage but not too tight so that you have long-term difficulty emptying your bladder. A mesh sling will not directly treat overactive bladder/urge incontinence and may worsen it.  There is an FDA safety notification on vaginal mesh procedures for prolapse but NOT mesh slings. We have extensive experience and training with mesh placement and we have close postoperative follow up to identify any potential complications from mesh. It is important to realize that this mesh is a permanent implant that cannot be easily removed. There are rare risks of mesh exposure (2-4%), pain with intercourse (0-7%), and infection (<1%). The risk of mesh exposure if more likely in a woman with risks for poor healing (prior radiation, poorly controlled diabetes, or immunocompromised). The risk of new or worsened chronic pain after mesh implant is more common in women with baseline chronic pain and/or poorly controlled  anxiety or depression. Approximately 2-4% of patients will experience longer-term post-operative voiding dysfunction that may require surgical revision of the sling. We also reviewed that postoperatively, her stream may not be as strong as before surgery.   Prolapse (with or without mesh): Risk factors for surgical failure  include things that put pressure on your pelvis and the surgical repair, including obesity, chronic cough, and heavy lifting or straining (including lifting children or adults, straining on the toilet, or lifting heavy objects such as furniture or anything weighing >25 lbs. Risks of recurrence is 20-30% with vaginal native tissue repair and a less than 10% with sacrocolpopexy with mesh.     We discussed consent for blood products. Risks for blood transfusion include allergic reactions, other reactions that can affect different body organs and managed accordingly, transmission of infectious diseases such as HIV or Hepatitis. However, the blood is screened. Patient consents for blood products.  Pre-operative instructions:  She was instructed to not take Aspirin/NSAIDs x 7days prior to surgery. Hold Zepbound until after she has a bowel movement post surgery. Antibiotic prophylaxis was ordered as indicated.  Catheter use: Patient will go home with foley if needed after post-operative voiding trial.  Post-operative instructions:  She was provided with specific post-operative instructions, including precautions and signs/symptoms for which we would recommend contacting us , in addition to daytime and after-hours contact phone numbers. This was provided on a handout.   Post-operative medications: Prescriptions for motrin , tylenol , miralax, and oxycodone  were sent to her pharmacy. Discussed using ibuprofen  and tylenol  on a schedule to limit use of narcotics.   Laboratory testing:  We will check labs: As requested by anesthesia.   Preoperative clearance:  She does not require surgical  clearance.    Post-operative follow-up:  A post-operative appointment will be made for 6 weeks from the date of surgery. If she needs a post-operative nurse visit for a voiding trial, that will be set up after she leaves the hospital.    Patient will call the clinic or use MyChart should anything change or any new issues arise.   Chelsa Stout G Willy Pinkerton, NP

## 2023-12-04 NOTE — H&P (Signed)
 [facility] H&P  Subjective Chief Complaint: Kerry [name] presents for a preoperative encounter.   History of Present Illness: Kerry [name] is a 43 y.o. female who presents for preoperative visit.  She is scheduled to undergo Exam under anesthesia, posterior repair, perineoplasty,  possible anterior repair, midurethral sling, cystourethroscopy  on 12/17/23.  Her symptoms include Pelvic organ prolapse and stress urinary incontinence, and she was was found to have Stage I anterior, Stage III posterior, Stage I apical prolapse.   Urodynamics showed: Deferred, + CST on prior exam   Past Medical History:  Diagnosis Date   Depression    mild ppd   Migraine    Ovarian cyst    Smoker    1-2 CIG PER DAY   Uterine fibroid      Past Surgical History:  Procedure Laterality Date   APPENDECTOMY  05/2008   DILATION AND EVACUATION N/A 09/27/2015   Procedure: DILATATION AND EVACUATION;  Surgeon: Duwaine Blumenthal, DO;  Location: [facility];  Service: Gynecology;  Laterality: N/A;   WISDOM TOOTH EXTRACTION      has no known allergies.   Family History  Problem Relation Age of Onset   Diabetes Maternal Grandmother    Cancer Maternal Grandmother        pancreas   Uterine cancer Maternal Grandmother    Skin cancer Maternal Grandmother    Diabetes Maternal Grandfather    Hypertension Maternal Grandfather    Diverticulitis Paternal Grandmother    Depression Paternal Grandmother    Alzheimer's disease Paternal Grandfather    Parkinson's disease Paternal Grandfather    Breast cancer Neg Hx    Bladder Cancer Neg Hx     Social History   Tobacco Use   Smoking status: Former    Current packs/day: 0.00    Types: Cigarettes    Quit date: 10/28/2011    Years since quitting: 12.1   Smokeless tobacco: Never  Vaping Use   Vaping status: Never Used  Substance Use Topics   Alcohol use: Yes    Comment: social   Drug use: No     Review of Systems was negative for a full 10 system  review except as noted in the History of Present Illness.  No current facility-administered medications for this encounter.  Current Outpatient Medications:    Ashwagandha 500 MG CAPS, as directed Orally, Disp: , Rfl:    buPROPion (WELLBUTRIN XL) 150 MG 24 hr tablet, Take 150 mg by mouth daily., Disp: , Rfl:    ibuprofen  (ADVIL ,MOTRIN ) 600 MG tablet, Take 1 tablet (600 mg total) by mouth every 6 (six) hours., Disp: 30 tablet, Rfl: 0   Magnesium 200 MG TABS, 2 tablets with a meal Orally Once a day, Disp: , Rfl:    ZEPBOUND 15 MG/0.5ML Pen, Inject into the skin., Disp: , Rfl:    Objective There were no vitals filed for this visit.   Gen: NAD CV: S1 S2 RRR Lungs: Clear to auscultation bilaterally Abd: soft, nontender   Previous Pelvic Exam showed: POP-Q   -1                                            Aa   -1  Ba   -7                                              C    3                                            Gh   3                                            Pb   10                                            tvl    1                                            Ap   1                                            Bp   -8                                          Assessment/ Plan  Assessment: The patient is a 43 y.o. year old scheduled to undergo Exam under anesthesia, posterior repair, perineoplasty,  possible anterior repair, midurethral sling, cystourethroscopy. Verbal consent was obtained for these procedures.

## 2023-12-11 ENCOUNTER — Encounter (HOSPITAL_COMMUNITY): Payer: Self-pay | Admitting: Obstetrics

## 2023-12-11 NOTE — Progress Notes (Signed)
 Spoke w/ via phone for pre-op interview--- Kerry [name] needs dos----  UPT per anesthesia.       [name] results------ COVID test -----patient states asymptomatic no test needed Arrive at -------1100 NPO after MN NO Solid Food.  Clear liquids from MN until---1000 Pre-Surgery Ensure or G2:  Med rec completed Medications to take morning of surgery -----NONE Diabetic medication -----  GLP1 agonist last dose: Zepbound 15mg  weekly. Last dose 12/02/23. GLP1 instructions: No further doses until after surgery. Pt verbalized understanding.  Patient instructed no nail polish to be worn day of surgery Patient instructed to bring photo id and insurance card day of surgery Patient aware to have Driver (ride ) / caregiver    for 24 hours after surgery - Husband Kerry [name] Patient Special Instructions ----- Pre-Op special Instructions -----  Patient verbalized understanding of instructions that were given at this phone interview. Patient denies chest pain, sob, fever, cough at the interview.

## 2023-12-17 ENCOUNTER — Ambulatory Visit (HOSPITAL_COMMUNITY): Admitting: Anesthesiology

## 2023-12-17 ENCOUNTER — Encounter (HOSPITAL_COMMUNITY): Payer: Self-pay | Admitting: Obstetrics

## 2023-12-17 ENCOUNTER — Encounter (HOSPITAL_COMMUNITY)
Admission: RE | Disposition: A | Discharge status: Home / Self Care | Payer: Self-pay | Source: Home / Self Care | Attending: Obstetrics

## 2023-12-17 ENCOUNTER — Ambulatory Visit (HOSPITAL_COMMUNITY)
Admission: RE | Admit: 2023-12-17 | Discharge: 2023-12-17 | Disposition: A | Discharge status: Home / Self Care | Attending: Obstetrics | Admitting: Obstetrics

## 2023-12-17 DIAGNOSIS — Z01818 Encounter for other preprocedural examination: Secondary | ICD-10-CM

## 2023-12-17 DIAGNOSIS — F32A Depression, unspecified: Secondary | ICD-10-CM | POA: Diagnosis not present

## 2023-12-17 DIAGNOSIS — Z79899 Other long term (current) drug therapy: Secondary | ICD-10-CM | POA: Diagnosis not present

## 2023-12-17 DIAGNOSIS — N941 Unspecified dyspareunia: Secondary | ICD-10-CM | POA: Insufficient documentation

## 2023-12-17 DIAGNOSIS — N812 Incomplete uterovaginal prolapse: Secondary | ICD-10-CM | POA: Diagnosis not present

## 2023-12-17 DIAGNOSIS — N816 Rectocele: Secondary | ICD-10-CM

## 2023-12-17 DIAGNOSIS — K5900 Constipation, unspecified: Secondary | ICD-10-CM | POA: Insufficient documentation

## 2023-12-17 DIAGNOSIS — Z87891 Personal history of nicotine dependence: Secondary | ICD-10-CM | POA: Insufficient documentation

## 2023-12-17 DIAGNOSIS — N3946 Mixed incontinence: Secondary | ICD-10-CM | POA: Insufficient documentation

## 2023-12-17 HISTORY — PX: BLADDER SUSPENSION: SHX72

## 2023-12-17 HISTORY — PX: PERINEOPLASTY: SHX2218

## 2023-12-17 HISTORY — PX: CYSTOCELE REPAIR: SHX163

## 2023-12-17 HISTORY — PX: RECTOCELE REPAIR: SHX761

## 2023-12-17 LAB — POCT PREGNANCY, URINE: Preg Test, Ur: NEGATIVE

## 2023-12-17 SURGERY — COLPORRHAPHY, POSTERIOR, FOR RECTOCELE REPAIR
Anesthesia: General | Site: Vagina

## 2023-12-17 MED ORDER — SODIUM CHLORIDE 0.9 % IR SOLN
Status: DC | PRN
  Administered 2023-12-17: 900 mL via INTRAVESICAL

## 2023-12-17 MED ORDER — ALBUMIN HUMAN 5 % IV SOLN
12.5000 g | Freq: Once | INTRAVENOUS | Status: AC
  Administered 2023-12-17: 12.5 g via INTRAVENOUS

## 2023-12-17 MED ORDER — PHENAZOPYRIDINE HCL 100 MG PO TABS
ORAL_TABLET | ORAL | Status: DC
Start: 2023-12-17 — End: 2023-12-17
  Filled 2023-12-17: qty 2

## 2023-12-17 MED ORDER — ALBUMIN HUMAN 5 % IV SOLN: INTRAVENOUS | Status: DC | PRN

## 2023-12-17 MED ORDER — PHENAZOPYRIDINE HCL 100 MG PO TABS
200.0000 mg | ORAL_TABLET | ORAL | Status: AC
  Administered 2023-12-17: 200 mg via ORAL

## 2023-12-17 MED ORDER — LIDOCAINE 2% (20 MG/ML) 5 ML SYRINGE
INTRAMUSCULAR | Status: DC | PRN
  Administered 2023-12-17: 30 mg via INTRAVENOUS

## 2023-12-17 MED ORDER — MIDAZOLAM HCL (PF) 2 MG/2ML IJ SOLN: 0.5000 mg | Freq: Once | INTRAMUSCULAR | Status: DC | PRN

## 2023-12-17 MED ORDER — CHLORHEXIDINE GLUCONATE 0.12 % MT SOLN
15.0000 mL | Freq: Once | OROMUCOSAL | Status: AC
  Administered 2023-12-17: 15 mL via OROMUCOSAL

## 2023-12-17 MED ORDER — GABAPENTIN 300 MG PO CAPS
300.0000 mg | ORAL_CAPSULE | ORAL | Status: AC
  Administered 2023-12-17: 300 mg via ORAL

## 2023-12-17 MED ORDER — CHLORHEXIDINE GLUCONATE 0.12 % MT SOLN
OROMUCOSAL | Status: AC
  Filled 2023-12-17: qty 15

## 2023-12-17 MED ORDER — LIDOCAINE-EPINEPHRINE 1 %-1:100000 IJ SOLN
INTRAMUSCULAR | Status: AC
  Filled 2023-12-17: qty 2

## 2023-12-17 MED ORDER — SCOPOLAMINE 1 MG/3DAYS TD PT72
1.0000 | MEDICATED_PATCH | Freq: Once | TRANSDERMAL | Status: DC
  Administered 2023-12-17: 1 mg via TRANSDERMAL

## 2023-12-17 MED ORDER — ACETAMINOPHEN 500 MG PO TABS
1000.0000 mg | ORAL_TABLET | ORAL | Status: AC
  Administered 2023-12-17: 1000 mg via ORAL

## 2023-12-17 MED ORDER — MIDAZOLAM HCL 2 MG/2ML IJ SOLN
INTRAMUSCULAR | Status: AC
Start: 2023-12-17 — End: 2023-12-17
  Filled 2023-12-17: qty 2

## 2023-12-17 MED ORDER — SCOPOLAMINE 1 MG/3DAYS TD PT72
MEDICATED_PATCH | TRANSDERMAL | Status: DC
Start: 2023-12-17 — End: 2023-12-17
  Filled 2023-12-17: qty 1

## 2023-12-17 MED ORDER — HYDROMORPHONE HCL 1 MG/ML IJ SOLN: 0.2500 mg | INTRAMUSCULAR | Status: DC | PRN

## 2023-12-17 MED ORDER — ROCURONIUM BROMIDE 10 MG/ML (PF) SYRINGE
PREFILLED_SYRINGE | INTRAVENOUS | Status: AC
Start: 2023-12-17 — End: 2023-12-17
  Filled 2023-12-17: qty 10

## 2023-12-17 MED ORDER — GABAPENTIN 300 MG PO CAPS
ORAL_CAPSULE | ORAL | Status: DC
Start: 2023-12-17 — End: 2023-12-17
  Filled 2023-12-17: qty 1

## 2023-12-17 MED ORDER — SODIUM CHLORIDE 0.9 % IV SOLN
INTRAVENOUS | Status: AC
  Filled 2023-12-17: qty 2

## 2023-12-17 MED ORDER — FENTANYL CITRATE (PF) 250 MCG/5ML IJ SOLN
INTRAMUSCULAR | Status: AC
  Filled 2023-12-17: qty 5

## 2023-12-17 MED ORDER — 0.9 % SODIUM CHLORIDE (POUR BTL) OPTIME
TOPICAL | Status: DC | PRN
  Administered 2023-12-17: 1000 mL

## 2023-12-17 MED ORDER — ONDANSETRON HCL 4 MG/2ML IJ SOLN
INTRAMUSCULAR | Status: AC
Start: 2023-12-17 — End: 2023-12-17
  Filled 2023-12-17: qty 2

## 2023-12-17 MED ORDER — DEXAMETHASONE SOD PHOSPHATE PF 10 MG/ML IJ SOLN
INTRAMUSCULAR | Status: DC | PRN
  Administered 2023-12-17: 10 mg via INTRAVENOUS

## 2023-12-17 MED ORDER — SODIUM CHLORIDE 0.9 % IV SOLN
2.0000 g | INTRAVENOUS | Status: AC
  Administered 2023-12-17 (×2): 2 g via INTRAVENOUS

## 2023-12-17 MED ORDER — LACTATED RINGERS IV SOLN: INTRAVENOUS | Status: DC

## 2023-12-17 MED ORDER — OXYCODONE HCL 5 MG PO TABS: 5.0000 mg | ORAL_TABLET | Freq: Once | ORAL | Status: DC | PRN

## 2023-12-17 MED ORDER — ORAL CARE MOUTH RINSE: 15.0000 mL | Freq: Once | OROMUCOSAL | Status: AC

## 2023-12-17 MED ORDER — PROPOFOL 10 MG/ML IV BOLUS
INTRAVENOUS | Status: DC | PRN
  Administered 2023-12-17: 150 mg via INTRAVENOUS

## 2023-12-17 MED ORDER — FENTANYL CITRATE (PF) 250 MCG/5ML IJ SOLN
INTRAMUSCULAR | Status: DC | PRN
  Administered 2023-12-17: 100 ug via INTRAVENOUS
  Administered 2023-12-17 (×2): 50 ug via INTRAVENOUS

## 2023-12-17 MED ORDER — SODIUM CHLORIDE (PF) 0.9 % IJ SOLN
INTRAMUSCULAR | Status: AC
Start: 2023-12-17 — End: 2023-12-17
  Filled 2023-12-17: qty 20

## 2023-12-17 MED ORDER — LIDOCAINE-EPINEPHRINE 1 %-1:100000 IJ SOLN
INTRAMUSCULAR | Status: DC | PRN
Start: 2023-12-17 — End: 2023-12-17
  Administered 2023-12-17: 40 mL

## 2023-12-17 MED ORDER — ALBUMIN HUMAN 5 % IV SOLN
INTRAVENOUS | Status: AC
Start: 2023-12-17 — End: 2023-12-17
  Filled 2023-12-17: qty 250

## 2023-12-17 MED ORDER — DEXMEDETOMIDINE HCL IN NACL 80 MCG/20ML IV SOLN
INTRAVENOUS | Status: DC | PRN
  Administered 2023-12-17: 8 ug via INTRAVENOUS

## 2023-12-17 MED ORDER — OXYCODONE HCL 5 MG/5ML PO SOLN: 5.0000 mg | Freq: Once | ORAL | Status: DC | PRN

## 2023-12-17 MED ORDER — ROCURONIUM BROMIDE 10 MG/ML (PF) SYRINGE
PREFILLED_SYRINGE | INTRAVENOUS | Status: DC | PRN
  Administered 2023-12-17: 60 mg via INTRAVENOUS
  Administered 2023-12-17 (×2): 20 mg via INTRAVENOUS

## 2023-12-17 MED ORDER — SODIUM CHLORIDE 0.9 % IV SOLN
2.0000 g | INTRAVENOUS | Status: DC
  Filled 2023-12-17: qty 2

## 2023-12-17 MED ORDER — PHENYLEPHRINE 80 MCG/ML (10ML) SYRINGE FOR IV PUSH (FOR BLOOD PRESSURE SUPPORT)
PREFILLED_SYRINGE | INTRAVENOUS | Status: DC | PRN
  Administered 2023-12-17: 80 ug via INTRAVENOUS
  Administered 2023-12-17: 160 ug via INTRAVENOUS
  Administered 2023-12-17 (×4): 80 ug via INTRAVENOUS

## 2023-12-17 MED ORDER — LIDOCAINE 2% (20 MG/ML) 5 ML SYRINGE
INTRAMUSCULAR | Status: AC
  Filled 2023-12-17: qty 5

## 2023-12-17 MED ORDER — PROPOFOL 10 MG/ML IV BOLUS
INTRAVENOUS | Status: AC
  Filled 2023-12-17: qty 20

## 2023-12-17 MED ORDER — KETOROLAC TROMETHAMINE 30 MG/ML IJ SOLN
INTRAMUSCULAR | Status: DC | PRN
  Administered 2023-12-17: 30 mg via INTRAVENOUS

## 2023-12-17 MED ORDER — MEPERIDINE HCL 25 MG/ML IJ SOLN: 6.2500 mg | INTRAMUSCULAR | Status: DC | PRN

## 2023-12-17 MED ORDER — PHENYLEPHRINE HCL-NACL 20-0.9 MG/250ML-% IV SOLN
INTRAVENOUS | Status: DC | PRN
  Administered 2023-12-17: 25 ug/min via INTRAVENOUS

## 2023-12-17 MED ORDER — ONDANSETRON HCL 4 MG/2ML IJ SOLN
INTRAMUSCULAR | Status: DC | PRN
  Administered 2023-12-17: 4 mg via INTRAVENOUS

## 2023-12-17 MED ORDER — MIDAZOLAM HCL (PF) 2 MG/2ML IJ SOLN
INTRAMUSCULAR | Status: DC | PRN
  Administered 2023-12-17: 2 mg via INTRAVENOUS

## 2023-12-17 MED ORDER — ACETAMINOPHEN 500 MG PO TABS
ORAL_TABLET | ORAL | Status: AC
  Filled 2023-12-17: qty 2

## 2023-12-17 MED ORDER — SUGAMMADEX SODIUM 200 MG/2ML IV SOLN
INTRAVENOUS | Status: DC | PRN
  Administered 2023-12-17: 200 mg via INTRAVENOUS

## 2023-12-17 SURGICAL SUPPLY — 37 items
BLADE CLIPPER SENSICLIP SURGIC (BLADE) ×3 IMPLANT
BLADE SURG 15 STRL LF DISP TIS (BLADE) ×3 IMPLANT
DERMABOND ADVANCED .7 DNX12 (GAUZE/BANDAGES/DRESSINGS) ×3 IMPLANT
GAUZE 4X4 16PLY ~~LOC~~+RFID DBL (SPONGE) IMPLANT
GLOVE BIOGEL PI IND STRL 6 (GLOVE) ×3 IMPLANT
GLOVE BIOGEL PI MICRO STRL 5.5 (GLOVE) ×3 IMPLANT
GLOVE SS PI 5.5 STRL (GLOVE) ×3 IMPLANT
GOWN STRL REUS W/ TWL LRG LVL3 (GOWN DISPOSABLE) ×3 IMPLANT
HIBICLENS CHG 4% 4OZ BTL (MISCELLANEOUS) ×3 IMPLANT
HOLDER FOLEY CATH W/STRAP (MISCELLANEOUS) ×3 IMPLANT
KIT TURNOVER KIT B (KITS) ×3 IMPLANT
NDL HYPO 22X1.5 SAFETY MO (MISCELLANEOUS) ×3 IMPLANT
NEEDLE HYPO 22X1.5 SAFETY MO (MISCELLANEOUS) ×3 IMPLANT
PACK VAGINAL WOMENS (CUSTOM PROCEDURE TRAY) ×3 IMPLANT
PAD OB MATERNITY 11 LF (PERSONAL CARE ITEMS) ×3 IMPLANT
RETRACTOR LONE STAR DISPOSABLE (INSTRUMENTS) ×3 IMPLANT
RETRACTOR STAY HOOK 5MM (MISCELLANEOUS) ×3 IMPLANT
SET CYSTO IRRIGATION (SET/KITS/TRAYS/PACK) ×3 IMPLANT
SET IRRIG Y TYPE TUR BLADDER L (SET/KITS/TRAYS/PACK) ×3 IMPLANT
SLEEVE SCD COMPRESS KNEE MED (STOCKING) ×3 IMPLANT
SLING ADVANTAGE FIT TRANVAG (Sling) IMPLANT
SOLN 0.9% NACL POUR BTL 1000ML (IV SOLUTION) ×3 IMPLANT
SPIKE FLUID TRANSFER (MISCELLANEOUS) ×3 IMPLANT
SUCTION TUBE FRAZIER 10FR DISP (SUCTIONS) ×3 IMPLANT
SURGIFLO W/THROMBIN 8M KIT (HEMOSTASIS) IMPLANT
SUT MON AB 2-0 SH27 (SUTURE) IMPLANT
SUT PDS AB 2-0 CT2 27 (SUTURE) ×3 IMPLANT
SUT VIC AB 0 CT1 27XBRD ANBCTR (SUTURE) IMPLANT
SUT VIC AB 0 CT1 27XCR 8 STRN (SUTURE) IMPLANT
SUT VIC AB 2-0 SH 27XBRD (SUTURE) ×3 IMPLANT
SUT VIC AB 3-0 SH 18 (SUTURE) IMPLANT
SUT VICRYL 2-0 SH 8X27 (SUTURE) IMPLANT
SYR BULB EAR ULCER 3OZ GRN STR (SYRINGE) ×3 IMPLANT
TOWEL GREEN STERILE (TOWEL DISPOSABLE) ×3 IMPLANT
TOWEL GREEN STERILE FF (TOWEL DISPOSABLE) ×3 IMPLANT
TRAY FOLEY W/BAG SLVR 14FR (SET/KITS/TRAYS/PACK) ×3 IMPLANT
TRAY FOLEY W/BAG SLVR 14FR LF (SET/KITS/TRAYS/PACK) ×3 IMPLANT

## 2023-12-17 NOTE — Discharge Instructions (Signed)

## 2023-12-17 NOTE — Op Note (Signed)
 Operative Note  Preoperative Diagnosis: stage II pelvic organ prolapse, mixed urinary incontinence, dyspareunia, constipation  Postoperative Diagnosis: stage II pelvic organ prolapse, mixed urinary incontinence, dyspareunia, constipation   Procedures performed:  Exam under anesthesia, posterior repair, perineoplasty, anterior repair, midurethral sling, cystourethroscopy   Implants:  Implant Name Type Inv. Item Serial No. Manufacturer Lot No. LRB No. Used Action  VALORIE CAROLENE BLUSH [facility] - [id number] Sling SLING ADVANTAGE FIT [facility]  [employer] [id number] N/A 1 Implanted    Attending Surgeon: Lianne Leila Gillis, MD  Assistant Surgeon: n/a  Assistant: n/a  Anesthesia: General endotracheal  Findings: 1. On vaginal exam, stage II prolapse present  2. On cystoscopy, normal bladder and urethral mucosa without injury or lesion. Brisk bilateral ureteral efflux present.    Specimens: * No specimens in log *  Estimated blood loss: 100 mL  IV fluids: 1450 mL  Urine output: 100 mL  Complications: none  Procedure in Detail: After informed consent was obtained, the patient was taken to the operating room where she was placed under anesthesia.  She was then placed in the dorsal lithotomy position with Allen stirrups and prepped and draped in the usual sterile fashion.  Care was taken to avoid hyperflexion or hyperextension of her lower extremities.    A self-retaining retractor was placed, and a foley catheter was placed. For the anterior repair, two Allis clamps were placed along the midline of the anterior vaginal wall.  1% lidocaine  with epinephrine  was injected into the vaginal mucosa.  A 15 blade scalpel was used to incise the vaginal mucosa in the midline. Allis clamps were placed along this incision and Metzenbaum scissors were used to sharply dissect the epithelium off of the vesicovaginal septum bilaterally to the level of the pubic rami. Anterior plication of the  vesicovaginal septum was then performed using 2-0 PDS. The vaginal mucosal edges were trimmed and the incision reapproximated with 2-0 Vicryl in a continuous running fashion. Hemostasis was noted. The uterosacral stitches were then attached to the posterior and anterior edges of the vaginal cuff on the ipsilateral sides, in a through and through fashion, using a free needle. The vaginal cuff was closed vertically in a continuous running fashion with an 0-Vicryl suture. The lateral uterosacral stitches were then tied down with good apical support noted. The Foley catheter was removed. A 70-degree cystoscope was introduced, and 360-degree inspection revealed no trauma in the bladder, with bilateral ureteral efflux. The cystoscope was removed. The medial uterosacral sutures were then tied down. Cystoscopy was repeated and brisk bilateral ureteral efflux was noted. The bladder was drained and the cystoscope was removed.  The Foley catheter was reinserted.  Two Allis clamps were placed at the level of the midurethra. 1% lidocaine  with epinephrine  was injected into the vaginal mucosa. A vertical incision was made between the two clamps using a 15-blade scalpel.  Using sharp dissection, Metzenbaum scissors were used to make a periurethral tunnel from the vaginal incision towards the pubic rami bilaterally for the future sling tracts. The bladder was ensured to be empty. The trocar and attached sling were introduced into the right side of the periurethral vaginal incision, just inferior to the pubic symphysis on the right side. The trocar was guided through the endopelvic fascia and directly vertically.  While hugging the cephalad surface of the pubic bone, the trocar was guided out through the abdomen 1 fingerbreadths lateral to midline at the level of the pubic symphysis on the ipsilateral side. The trocar was placed  on the left side in a similar fashion.  A 70-degree cystoscope was introduced, and 360-degree inspection  revealed no trauma or trocars in the bladder, with brisk bilateral ureteral efflux.  The bladder was drained and the cystoscope was removed.  The Foley catheter was reinserted.  The sling was brought to lie beneath the mid-urethra with a loop of mesh held by a Babcock clamp. The Babcock clamp was then release and a right angle clamp was placed behind the sling to ensure no tension.   The plastic sheath was removed from the sling and the distal ends of the sling were trimmed just below the level of the skin incisions.  Tension-free positioning of the sling was confirmed. Vaginal inspection revealed no vaginotomy or sling perforations of the mucosa.  The vaginal mucosal edges were reapproximated using 2-0 Vicryl.  The vagina was copiously irrigated.  Hemostasis was again noted. Vaginal packing not placed. The suprapubic sling incisions were closed with Dermabond.  Attention was then turned to the posterior vagina.  Two Allis clamps were placed at the introitus approximately 3cm from the urethra meatus. 1% lidocaine  with epinephrine  was injected into the vaginal mucosa in the posterior vaginal wall and perineum for hydrodissection and hemostasis. A diamond shape incision was made between these clamps with a 15 blade scalpel and a diamond shaped area of epithelium was cut at the introitus.  The rectovaginal septum was then dissected off the vaginal mucosa bilaterally. The rectovaginal septum was then plicated in a continuous running fashion with 2-0 PDS while one finger was placed in the rectum to prevent rectal penetration.  After placement of the first plication stitch two fingers were inserted into the vaginal to confirm adequate caliber.  The suture incorporated the perineal body in a U stitch fashion and the bulbocavernosus muscles. A 2-0 Vicryl was used in a subcuticular fashion to re-approximate the hymenal ring. After plication, the excess vaginal mucosa was trimmed and the vaginal mucosa was reapproximated  using 2-0 Vicryl suture in a continuous fashion.  The vagina was copiously irrigated.  Hemostasis was noted. A rectal examination was normal and confirmed no sutures within the rectum. Three fingers passed through the vaginal opening without difficulty.  The patient tolerated the procedure well.  She was awakened from anesthesia and transferred to the recovery room in stable condition. All counts were correct x 2.

## 2023-12-17 NOTE — Anesthesia Procedure Notes (Signed)
 Procedure Name: Intubation Date/Time: 12/17/2023 1:34 PM  Performed by: Jolynn Mage, CRNAPre-anesthesia Checklist: Patient identified, Patient being monitored, Timeout performed, Emergency Drugs available and Suction available Patient Re-evaluated:Patient Re-evaluated prior to induction Oxygen Delivery Method: Circle System Utilized Preoxygenation: Pre-oxygenation with 100% oxygen Induction Type: IV induction Ventilation: Mask ventilation without difficulty Laryngoscope Size: Miller and 2 Grade View: Grade I Tube type: Oral Tube size: 7.0 mm Number of attempts: 1 Airway Equipment and Method: Stylet Placement Confirmation: ETT inserted through vocal cords under direct vision, positive ETCO2 and breath sounds checked- equal and bilateral Secured at: 21 cm Tube secured with: Tape Dental Injury: Teeth and Oropharynx as per pre-operative assessment

## 2023-12-17 NOTE — Transfer of Care (Signed)
 Immediate Anesthesia Transfer of Care Note  Patient: Kerry [name]  Procedure(s) Performed: COLPORRHAPHY, POSTERIOR, FOR RECTOCELE REPAIR (Vagina ) PERINEOPLASTY (Perineum) CREATION, URETHRAL SLING, RETROPUBIC APPROACH, USING POLYPROPYLENE TAPE (Vagina ) COLPORRHAPHY, ANTERIOR, FOR CYSTOCELE REPAIR  Patient Location: PACU  Anesthesia Type:General  Level of Consciousness: drowsy  Airway & Oxygen Therapy: Patient Spontanous Breathing and Patient connected to nasal cannula oxygen  Post-op Assessment: Report given to RN and Post -op Vital signs reviewed and stable  Post vital signs: Reviewed and stable  Last Vitals:  Vitals Value Taken Time  BP 96/48 12/17/23 15:51  Temp 37.1 C 12/17/23 15:51  Pulse 92 12/17/23 15:54  Resp 13 12/17/23 15:54  SpO2 97 % 12/17/23 15:54  Vitals shown include unfiled device data.  Last Pain:  Vitals:   12/17/23 1551  TempSrc:   PainSc: 0-No pain      Patients Stated Pain Goal: 4 (12/17/23 1130)  Complications: No notable events documented.

## 2023-12-17 NOTE — Interval H&P Note (Signed)
 History and Physical Interval Note:  12/17/2023 12:24 PM  Kerry [name]  has presented today for surgery, with the diagnosis of Pelvic organ prolapse; mixed incontinence.  The various methods of treatment have been discussed with the patient and family. After consideration of risks, benefits and other options for treatment, the patient has consented to  Procedure(s) with comments: COLPORRHAPHY, POSTERIOR, FOR RECTOCELE REPAIR (N/A) PERINEOPLASTY (N/A) CREATION, URETHRAL SLING, RETROPUBIC APPROACH, USING POLYPROPYLENE TAPE (N/A) - TVT advantage fit COLPORRHAPHY, ANTERIOR, FOR CYSTOCELE REPAIR (N/A) - Anterior repair is possible as a surgical intervention.  The patient's history has been reviewed, patient examined, no change in status, stable for surgery.  I have reviewed the patient's chart and labs.  Questions were answered to the patient's satisfaction.     [name] ONEIDA Gillis

## 2023-12-17 NOTE — Anesthesia Preprocedure Evaluation (Addendum)
 Anesthesia Evaluation  Patient identified by MRN, date of birth, ID band Patient awake    Reviewed: Allergy & Precautions, NPO status , Patient's Chart, lab work & pertinent test results  History of Anesthesia Complications Negative for: history of anesthetic complications  Airway Mallampati: I  TM Distance: >3 FB Neck ROM: Full    Dental  (+) Dental Advisory Given, Teeth Intact   Pulmonary former smoker   breath sounds clear to auscultation       Cardiovascular negative cardio ROS  Rhythm:Regular Rate:Normal     Neuro/Psych  Headaches   Depression       GI/Hepatic negative GI ROS, Neg liver ROS,,,  Endo/Other  Zepbound BMI 33  Renal/GU negative Renal ROS     Musculoskeletal   Abdominal   Peds  Hematology negative hematology ROS (+)   Anesthesia Other Findings   Reproductive/Obstetrics                              Anesthesia Physical Anesthesia Plan  ASA: 2  Anesthesia Plan: General   Post-op Pain Management: Tylenol  PO (pre-op)*   Induction: Intravenous  PONV Risk Score and Plan: 2 and Ondansetron , Dexamethasone  and Scopolamine  patch - Pre-op  Airway Management Planned: Oral ETT  Additional Equipment: None  Intra-op Plan:   Post-operative Plan: Extubation in OR  Informed Consent: I have reviewed the patients History and Physical, chart, labs and discussed the procedure including the risks, benefits and alternatives for the proposed anesthesia with the patient or authorized representative who has indicated his/her understanding and acceptance.     Dental advisory given  Plan Discussed with: CRNA and Surgeon  Anesthesia Plan Comments:          Anesthesia Quick Evaluation

## 2023-12-17 NOTE — Anesthesia Postprocedure Evaluation (Signed)
 Anesthesia Post Note  Patient: Kerry [name]  Procedure(s) Performed: COLPORRHAPHY, POSTERIOR, FOR RECTOCELE REPAIR (Vagina ) PERINEOPLASTY (Perineum) CREATION, URETHRAL SLING, RETROPUBIC APPROACH, USING POLYPROPYLENE TAPE (Vagina ) COLPORRHAPHY, ANTERIOR, FOR CYSTOCELE REPAIR     Patient location during evaluation: PACU Anesthesia Type: General Level of consciousness: awake and alert, patient cooperative and oriented Pain management: pain level controlled Vital Signs Assessment: post-procedure vital signs reviewed and stable Respiratory status: spontaneous breathing, nonlabored ventilation and respiratory function stable Cardiovascular status: blood pressure returned to baseline and stable Postop Assessment: no apparent nausea or vomiting and adequate PO intake Anesthetic complications: no   No notable events documented.  Last Vitals:  Vitals:   12/17/23 1700 12/17/23 1730  BP: (!) 92/54 105/70  Pulse: 89 99  Resp: (!) 21 (!) 26  Temp:  36.6 C  SpO2: 100% 97%    Last Pain:  Vitals:   12/17/23 1730  TempSrc:   PainSc: 0-No pain                 [name],E. Dezmon Conover

## 2023-12-18 ENCOUNTER — Encounter (HOSPITAL_COMMUNITY): Payer: Self-pay | Admitting: Obstetrics

## 2023-12-21 ENCOUNTER — Encounter: Payer: Self-pay | Admitting: Obstetrics

## 2024-01-01 MED ORDER — ESTRADIOL 0.01 % VA CREA: 0.5000 g | TOPICAL_CREAM | VAGINAL | 11 refills | Status: DC

## 2024-01-28 ENCOUNTER — Encounter: Payer: Self-pay | Admitting: Obstetrics

## 2024-01-28 ENCOUNTER — Ambulatory Visit: Admitting: Obstetrics

## 2024-01-28 VITALS — BP 127/89 | HR 98

## 2024-01-28 DIAGNOSIS — Z48816 Encounter for surgical aftercare following surgery on the genitourinary system: Secondary | ICD-10-CM

## 2024-01-28 DIAGNOSIS — N3941 Urge incontinence: Secondary | ICD-10-CM

## 2024-01-28 MED ORDER — MIRABEGRON ER 25 MG PO TB24: 25.0000 mg | ORAL_TABLET | Freq: Every day | ORAL | 0 refills | Status: DC

## 2024-01-28 NOTE — Patient Instructions (Signed)
 Consider returning to pelvic floor PT if you are bothered by your urgency urinary leakage.   You can also consider starting medication for overactive bladder.  For Beta-3 agonist medication,there is a potential side effect of elevated blood pressure which is more likely to occur in individuals with uncontrolled hypertension. It appears that your most recent blood pressure is within normal limits. Please monitor your blood pressure and stop the medication if you experience any headache, chest discomfort, or shortness of breath and seek care immediately.  I have sent your prescription of mirabegron  to your pharmacy. Start at 25mg  daily for 1 month, if your blood pressure remains unchanged, increase to 50mg  after 1 month and continue to monitor your blood pressure.  Resume exercising, modify exercises if you experience any discomfort.

## 2024-01-28 NOTE — Progress Notes (Signed)
 [facility]  Date of Visit: 01/28/2024  History of Present Illness: Kerry [name] is a 43 y.o. female scheduled today for a 6 weeks post-operative visit.   Surgery: s/p Exam under anesthesia, posterior repair, perineoplasty, anterior repair, midurethral sling, cystourethroscopy on 12/17/23  She passed her postoperative void trial POD#0.   Postoperative course was uncomplicated.   Today she reports discomfort when she sits on hard surfaces at [facility] with tightness at vaginal opening. Improved since time from surgery.  Reports dime size light brown/clear vaginal discharge managed by panty liner  Denies urinary leakage, itching.   UTI in the last 6 weeks? No  Pain? No  She has not returned to her normal activity (except for postop restrictions) Vaginal bulge? No  Stress incontinence: No  Urgency/frequency: No  voids 7-8x/day, 0-1x/night Urge incontinence: Yes UUI 2-3x/week  Voiding dysfunction: No  Bowel issues: Yes   Subjective Success: Do you usually have a bulge or something falling out that you can see or feel in the vaginal area? No  Retreatment Success: Any retreatment with surgery or pessary for any compartment? No   Pathology results: n/a  Medications: She has a current medication list which includes the following prescription(s): mirabegron  er, acetaminophen , ashwagandha, bupropion, estradiol , ibuprofen , magnesium, oxycodone , and zepbound.   Allergies: Patient has no known allergies.   Physical Exam: BP 127/89   Pulse 98   Abdomen: soft, non-tender, without masses or organomegaly Suprapubic Incisions: healing well, no significant drainage, no dehiscence, no significant erythema.  Pelvic Examination: Vagina: Incisions healing well, no significant drainage, no dehiscence, no significant erythema. Sutures are present at incision line and there is not granulation tissue. No tenderness along the anterior or posterior vagina. No apical tenderness. No pelvic  masses. No visible or palpable mesh.  POP-Q: POP-Q  3                                            Aa   -3                                           Ba  -8                                              C   3                                            Gh  4                                            Pb  10                                            tvl   -3  Ap  -3                                            Bp  -10                                              D    ---------------------------------------------------------  Assessment and Plan:  1. Urge urinary incontinence    Urge urinary incontinence Assessment & Plan: - s/p EUA, anterior/posterior repair, perineoplasty, midurethral sling and cystoscopy 12/17/23 - continues to report UUI 2-3x/week, improved since immediately postop - failed pessary prior to surgery due to discomfort - We discussed the symptoms of overactive bladder (OAB), which include urinary urgency, urinary frequency, nocturia, with or without urge incontinence.  While we do not know the exact etiology of OAB, several treatment options exist. We discussed management including behavioral therapy (decreasing bladder irritants, urge suppression strategies, timed voids, bladder retraining), physical therapy, medication; for refractory cases posterior tibial nerve stimulation, sacral neuromodulation, and intravesical botulinum toxin injection.  For anticholinergic medications, we discussed the potential side effects of anticholinergics including dry eyes, dry mouth, constipation, cognitive impairment and urinary retention. For Beta-3 agonist medication, we discussed the potential side effect of elevated blood pressure which is more likely to occur in individuals with uncontrolled hypertension. - prior use of Gemtesa  with reduction of SUI and UUI, cost prohibitive - encouraged to consider pelvic floor PT  - if refractory, Rx  to start mirabegron  25mg  and increase to 50mg  if BP WNL  Orders: -     Mirabegron  ER; Take 1 tablet (25 mg total) by mouth daily.  Dispense: 30 tablet; Refill: 0  - Can resume regular activity including exercise and intercourse,  if desired.  - Discussed avoidance of heavy lifting and straining long term to reduce the risk of recurrence.   All questions answered.   Return in about 6 weeks (around 03/10/2024) for postop.

## 2024-01-28 NOTE — Assessment & Plan Note (Signed)
-   s/p EUA, anterior/posterior repair, perineoplasty, midurethral sling and cystoscopy 12/17/23 - continues to report UUI 2-3x/week, improved since immediately postop - failed pessary prior to surgery due to discomfort - We discussed the symptoms of overactive bladder (OAB), which include urinary urgency, urinary frequency, nocturia, with or without urge incontinence.  While we do not know the exact etiology of OAB, several treatment options exist. We discussed management including behavioral therapy (decreasing bladder irritants, urge suppression strategies, timed voids, bladder retraining), physical therapy, medication; for refractory cases posterior tibial nerve stimulation, sacral neuromodulation, and intravesical botulinum toxin injection.  For anticholinergic medications, we discussed the potential side effects of anticholinergics including dry eyes, dry mouth, constipation, cognitive impairment and urinary retention. For Beta-3 agonist medication, we discussed the potential side effect of elevated blood pressure which is more likely to occur in individuals with uncontrolled hypertension. - prior use of Gemtesa  with reduction of SUI and UUI, cost prohibitive - encouraged to consider pelvic floor PT  - if refractory, Rx to start mirabegron  25mg  and increase to 50mg  if BP WNL

## 2024-02-02 MED ORDER — ESTRADIOL 0.01 % VA CREA: 0.5000 g | TOPICAL_CREAM | VAGINAL | 11 refills | Status: AC

## 2024-02-22 ENCOUNTER — Encounter: Payer: Self-pay | Admitting: *Deleted

## 2024-03-09 NOTE — Progress Notes (Unsigned)
 [facility]  Date of Visit: 03/10/2024  History of Present Illness: [name] is a 44 y.o. female scheduled today for a 11 weeks post-operative visit.   Surgery: s/p Exam under anesthesia, posterior repair, perineoplasty, anterior repair, midurethral sling, cystourethroscopy on 12/17/23  She passed her postoperative void trial POD#0.   Postoperative course was uncomplicated.   Today she reports discomfort when she sits on hard surfaces at [facility] with tightness at vaginal opening. Improved since time from surgery.  Reports dime size light brown/clear vaginal discharge managed by panty liner  Denies urinary leakage, itching.   UTI in the last 6 weeks? No  Pain? No  She has not returned to her normal activity (except for postop restrictions) Vaginal bulge? No  Stress incontinence: No  Urgency/frequency: No  voids 7-8x/day, 0-1x/night Urge incontinence: Yes UUI 2-3x/week  Voiding dysfunction: No  Bowel issues: Yes   Subjective Success: Do you usually have a bulge or something falling out that you can see or feel in the vaginal area? No  Retreatment Success: Any retreatment with surgery or pessary for any compartment? No   Pathology results: n/a  Medications: She has a current medication list which includes the following prescription(s): acetaminophen , ashwagandha, bupropion, estradiol , ibuprofen , magnesium, mirabegron  er, oxycodone , and zepbound.   Allergies: Patient has no known allergies.   Physical Exam: There were no vitals taken for this visit.  Abdomen: soft, non-tender, without masses or organomegaly Suprapubic Incisions: healing well, no significant drainage, no dehiscence, no significant erythema.  Pelvic Examination: Vagina: Incisions healing well, no significant drainage, no dehiscence, no significant erythema. Sutures are present at incision line and there is not granulation tissue. No tenderness along the anterior or posterior vagina. No apical  tenderness. No pelvic masses. No visible or palpable mesh.  POP-Q: POP-Q                                               Aa                                               Ba                                                 C                                                Gh                                               Pb                                               tvl  Ap                                               Bp                                                 D    ---------------------------------------------------------  Assessment and Plan:  No diagnosis found.  There are no diagnoses linked to this encounter. - Can resume regular activity including exercise and intercourse,  if desired.  - Discussed avoidance of heavy lifting and straining long term to reduce the risk of recurrence.   All questions answered.   No follow-ups on file.

## 2024-03-10 ENCOUNTER — Encounter: Payer: Self-pay | Admitting: Obstetrics

## 2024-03-10 ENCOUNTER — Ambulatory Visit: Admitting: Obstetrics

## 2024-03-10 VITALS — BP 112/80 | HR 73

## 2024-03-10 DIAGNOSIS — N3941 Urge incontinence: Secondary | ICD-10-CM

## 2024-03-10 DIAGNOSIS — Z48816 Encounter for surgical aftercare following surgery on the genitourinary system: Secondary | ICD-10-CM

## 2024-03-10 NOTE — Patient Instructions (Addendum)
 Please call [phone] to schedule the earliest appointment for pelvic floor PT.  Please return if you experience any change in vaginal and urinary symptoms.

## 2024-03-10 NOTE — Assessment & Plan Note (Signed)
-   s/p EUA, anterior/posterior repair, perineoplasty, midurethral sling and cystoscopy 12/17/23 - UUI 2-3x/week resolved - failed pessary prior to surgery due to discomfort - We discussed the symptoms of overactive bladder (OAB), which include urinary urgency, urinary frequency, nocturia, with or without urge incontinence.  While we do not know the exact etiology of OAB, several treatment options exist. We discussed management including behavioral therapy (decreasing bladder irritants, urge suppression strategies, timed voids, bladder retraining), physical therapy, medication; for refractory cases posterior tibial nerve stimulation, sacral neuromodulation, and intravesical botulinum toxin injection.  For anticholinergic medications, we discussed the potential side effects of anticholinergics including dry eyes, dry mouth, constipation, cognitive impairment and urinary retention. For Beta-3 agonist medication, we discussed the potential side effect of elevated blood pressure which is more likely to occur in individuals with uncontrolled hypertension. - prior use of Gemtesa  with reduction of SUI and UUI, cost prohibitive - referral sent for pelvic floor PT  - if symptoms return, consider Rx to start mirabegron  25mg  and increase to 50mg  if BP WNL

## 2024-03-21 ENCOUNTER — Ambulatory Visit: Admitting: Physical Therapy
